# Patient Record
Sex: Male | Born: 1985 | Hispanic: Yes | Marital: Married | Smoking: Never smoker
Health system: Southern US, Community
[De-identification: ages and names within clinical notes are randomized; demographics above are authoritative.]

---

## 2014-12-28 ENCOUNTER — Emergency Department (HOSPITAL_COMMUNITY): Payer: Self-pay

## 2014-12-28 ENCOUNTER — Encounter (HOSPITAL_COMMUNITY): Payer: Self-pay | Admitting: Emergency Medicine

## 2014-12-28 ENCOUNTER — Emergency Department (HOSPITAL_COMMUNITY)
Admission: EM | Admit: 2014-12-28 | Discharge: 2014-12-28 | Disposition: A | Discharge status: Home / Self Care | Payer: Self-pay | Attending: Emergency Medicine | Admitting: Emergency Medicine

## 2014-12-28 DIAGNOSIS — J189 Pneumonia, unspecified organism: Secondary | ICD-10-CM

## 2014-12-28 DIAGNOSIS — J159 Unspecified bacterial pneumonia: Secondary | ICD-10-CM | POA: Insufficient documentation

## 2014-12-28 DIAGNOSIS — R000 Tachycardia, unspecified: Secondary | ICD-10-CM | POA: Insufficient documentation

## 2014-12-28 LAB — CBC WITH DIFFERENTIAL/PLATELET
BASOS ABS: 0 10*3/uL (ref 0.0–0.1)
Basophils Relative: 0 % (ref 0–1)
EOS ABS: 0.1 10*3/uL (ref 0.0–0.7)
Eosinophils Relative: 2 % (ref 0–5)
HEMATOCRIT: 39.5 % (ref 39.0–52.0)
HEMOGLOBIN: 13.4 g/dL (ref 13.0–17.0)
Lymphocytes Relative: 19 % (ref 12–46)
Lymphs Abs: 1.1 10*3/uL (ref 0.7–4.0)
MCH: 29.4 pg (ref 26.0–34.0)
MCHC: 33.9 g/dL (ref 30.0–36.0)
MCV: 86.6 fL (ref 78.0–100.0)
Monocytes Absolute: 0.6 10*3/uL (ref 0.1–1.0)
Monocytes Relative: 11 % (ref 3–12)
Neutro Abs: 4 10*3/uL (ref 1.7–7.7)
Neutrophils Relative %: 68 % (ref 43–77)
PLATELETS: 223 10*3/uL (ref 150–400)
RBC: 4.56 MIL/uL (ref 4.22–5.81)
RDW: 12.9 % (ref 11.5–15.5)
WBC: 5.8 10*3/uL (ref 4.0–10.5)

## 2014-12-28 LAB — BASIC METABOLIC PANEL
ANION GAP: 9 (ref 5–15)
BUN: 10 mg/dL (ref 6–20)
CALCIUM: 8.7 mg/dL — AB (ref 8.9–10.3)
CO2: 23 mmol/L (ref 22–32)
CREATININE: 0.75 mg/dL (ref 0.61–1.24)
Chloride: 102 mmol/L (ref 101–111)
GLUCOSE: 101 mg/dL — AB (ref 65–99)
Potassium: 3.7 mmol/L (ref 3.5–5.1)
Sodium: 134 mmol/L — ABNORMAL LOW (ref 135–145)

## 2014-12-28 MED ORDER — ACETAMINOPHEN 325 MG PO TABS
650.0000 mg | ORAL_TABLET | Freq: Once | ORAL | Status: AC
Start: 2014-12-28 — End: 2014-12-28
  Administered 2014-12-28: 650 mg via ORAL

## 2014-12-28 MED ORDER — AZITHROMYCIN 250 MG PO TABS: ORAL_TABLET | ORAL | Status: DC

## 2014-12-28 MED ORDER — GUAIFENESIN ER 600 MG PO TB12: 1200.0000 mg | ORAL_TABLET | Freq: Two times a day (BID) | ORAL | Status: DC

## 2014-12-28 MED ORDER — GUAIFENESIN-CODEINE 100-10 MG/5ML PO SOLN: 5.0000 mL | Freq: Four times a day (QID) | ORAL | Status: DC | PRN

## 2014-12-28 MED ORDER — ACETAMINOPHEN 325 MG PO TABS
ORAL_TABLET | ORAL | Status: AC
  Filled 2014-12-28: qty 2

## 2014-12-28 NOTE — ED Provider Notes (Signed)
CSN: [id number]     Arrival date & time 12/28/14  1910 History  This chart was scribed for Arthor Captain, working with Eber Hong, MD by Placido Sou, ED Scribe. This patient was seen in room TR03C/TR03C and the patient's care was started at 10:28 PM.     Chief Complaint  Patient presents with  . Cough    The patient said he has been coughing, fever and general malaise since friday.  He says his back and epigastric area hurts from coughing.  he has taken OTC remedies and nothing is working.  . Fever    The history is provided by the patient. A language interpreter was used.    HPI Comments: Cire Clute is a 29 y.o. male with a prior history of pneumonia, who presents to the Emergency Department complaining of a severe cough with onset 5 days ago. Pt notes pain to chest and back, fever, resolved headache, and diaphoresis. Pt has taken Motrin, Advil and cough syrup with no signs of improvement. Pt had a previous diagnosis of pneumonia in 2011 and notes these symptoms are similar.  Patient denies history of smoking or a PMHx of asthma and any other associated prior medical issues.    No intake or output data in the 24 hours ending 12/28/14 2203  History reviewed. No pertinent past medical history. History reviewed. No pertinent past surgical history. History reviewed. No pertinent family history. History  Substance Use Topics  . Smoking status: Never Smoker   . Smokeless tobacco: Never Used  . Alcohol Use: No    Review of Systems  A complete 10 system review of systems was obtained and all systems are negative except as noted in the HPI and PMH.      Allergies  Review of patient's allergies indicates not on file.  Home Medications   Prior to Admission medications   Not on File   BP 129/71 mmHg  Pulse 104  Temp(Src) 99.8 F (37.7 C) (Oral)  Resp 24  Ht  (1.6 m)  Wt 150 lb (68.04 kg)  BMI 26.58 kg/m2  SpO2 96% Physical Exam  Constitutional: He is  oriented to person, place, and time. He appears well-developed and well-nourished. No distress.  Sweaty and febrile;   HENT:  Head: Normocephalic and atraumatic.  Eyes: Conjunctivae and EOM are normal.  Neck: Neck supple.  Cardiovascular: Normal rate.   Heart mildly tachy  Pulmonary/Chest: Breath sounds normal. No respiratory distress.  Lung sounds normal;     Neurological: He is alert and oriented to person, place, and time.  Skin: Skin is warm.  Psychiatric: His behavior is normal.  Nursing note and vitals reviewed.   ED Course  Procedures  DIAGNOSTIC STUDIES: Oxygen Saturation is 96% on RA, adequate by my interpretation.    COORDINATION OF CARE: 10:36 PM Discussed treatment plan with pt at bedside including monitoring of O2 levels when ambulating as well as  additional medications. Pt also requested a note for work. Pt agreed to plan.  Labs Review Labs Reviewed  BASIC METABOLIC PANEL - Abnormal; Notable for the following:    Sodium 134 (*)    Glucose, Bld 101 (*)    Calcium 8.7 (*)    All other components within normal limits  CBC WITH DIFFERENTIAL/PLATELET    Imaging Review No results found.   EKG Interpretation None      MDM   Final diagnoses:  Community acquired pneumonia    Patient with community acquired Pneumonia. His  fever is resolved with antipyretic. Patient is able to ambulate in the ED with 02 sats above 90%. The patient will be discharged with abx and cough suppressant.labsa are reassuring. I personally reviewed the images using the PACS System. Discussed return precautions I personally performed the services described in this documentation, which was scribed in my presence. The recorded information has been reviewed and is accurate.       Arthor Captainbigail Mohannad Olivero, PA-C 01/02/15 2018  Eber HongBrian Miller, MD 01/03/15 [phone]

## 2014-12-28 NOTE — Discharge Instructions (Signed)
Neumona (Pneumonia) La neumona es una infeccin en los pulmones.  CAUSAS La neumona puede estar causada por una bacteria o un virus. Generalmente, estas infecciones estn causadas por la aspiracin de partculas infecciosas que ingresan a los pulmones (vas respiratorias). SIGNOS Y SNTOMAS   Tos.  Fiebre.  Dolor en el pecho.  Frecuencia respiratoria aumentada.  Sibilancias.  Produccin de mucosidad. DIAGNSTICO  Si presenta los sntomas comunes de la neumona, el mdico normalmente confirmar el diagnstico con una radiografa de trax. Si tiene neumona, la radiografa mostrar una anomala en el pulmn (infiltrados pulmonares). Podrn realizarse otras pruebas de sangre, orina o esputo para encontrar la causa especfica de su neumona. El mdico tambin puede hacer pruebas (como gases en sangre o una oximetra de pulso) para verificar el correcto funcionamiento de los pulmones. TRATAMIENTO  Algunos tipos de neumona pueden contagiarse a otras personas al toser o estornudar. Es posible que le pidan que utilice una mscara antes y durante el examen. Si la neumona est causada por una bacteria, puede tratarse con medicamentos antibiticos. Si la neumona es causada por el virus de la gripe, puede tratarse con medicamentos antivirales. La mayora de las dems infecciones virales deben seguir su curso. Estas infecciones no respondern a los antibiticos.  INSTRUCCIONES PARA EL CUIDADO EN EL HOGAR   Los inhibidores de la tos pueden utilizarse si no descansa bien. Sin embargo, la tos, al limpiar los pulmones, brinda una proteccin. Debe evitar, en lo posible, utilizar medicamentos para detener la tos.  Es posible que el mdico le haya recetado medicamentos si cree que la causa de su neumona es una bacteria o gripe. Finalice los medicamentos, aunque comience a sentirse mejor.  El mdico tambin puede haberle recetado un expectorante. Este afloja la mucosidad, para poder eliminarla con la  tos.  Tome los medicamentos solamente como se lo haya indicado el mdico.  No fume. El fumar es una de las causas ms frecuentes de bronquitis y puede contribuir a la neumona. Si es fumador y contina hacindolo, la tos puede durar varias semanas despus de que la neumona haya desaparecido.  Un vaporizador o humidificador con vapor fro en la habitacin o en la casa puede ayudar a aflojar la mucosidad.  La tos generalmente empeora por la noche. Duerma semisentado en una reposera o use un par de almohadas debajo de la cabeza.  Haga reposo todo el tiempo que lo necesite. El organismo por lo general le har saber si tiene ganas de descansar. PREVENCIN La vacuna antineumocccica est disponible para prevenir la neumona bacteriana comn. Habitualmente, se recomienda para:  Personas mayores de 65 aos.  Pacientes que estn en tratamiento de quimioterapia.  Personas con trastornos pulmonares crnicos, como bronquitis o enfisema.  Personas con problemas del sistema inmunolgico. Si usted es mayor de 65 aos o tiene un trastorno que lo pone en situacin de alto riesgo, es posible que reciba una vacuna antineumoccica, si todava no la tiene. En algunos pases, tambin se recomienda la aplicacin de rutina de la vacuna contra la gripe. Esta vacuna puede ayudar a prevenir algunos casos de neumona. Es posible que le ofrezcan aplicarse la vacuna contra la gripe como parte del tratamiento. Si fuma, es el momento de abandonar el hbito. Puede recibir instrucciones acerca de cmo dejar de fumar. El mdico puede darle medicamentos y asesoramiento para ayudarlo. SOLICITE ATENCIN MDICA SI: Tiene fiebre. SOLICITE ATENCIN MDICA DE INMEDIATO SI:   La enfermedad empeora. Esto vale especialmente en el caso de que usted sea una persona   mayor o se encuentre dbil por otra enfermedad.  No puede controlar la tos con antitusivos y no puede dormir debido a ello.  Comienza a escupir sangre al toser.  El  dolor empeora o no puede controlarlo con los medicamentos.  Alguno de los sntomas que inicialmente lo llevaron a la consulta empeora en vez de mejorar.  Siente falta de aire o dolor en el pecho. ASEGRESE DE QUE:   Comprende estas instrucciones.  Controlar su afeccin.  Recibir ayuda de inmediato si no mejora o si empeora. Document Released: 05/02/2005 Document Revised: 12/07/2013 ExitCare Patient Information 2015 [employer]. This information is not intended to replace advice given to you by your health care provider. Make sure you discuss any questions you have with your health care provider.  

## 2014-12-28 NOTE — ED Notes (Signed)
The patient said he has been coughing, fever and general malaise since friday.  He says his back and epigastric area hurts from coughing.  he has taken OTC remedies and nothing is working.  The patient rates his pain 10/10.

## 2014-12-28 NOTE — ED Notes (Signed)
97% on RA while ambulating 

## 2014-12-28 NOTE — ED Notes (Signed)
Pt. Left with all belongings and refused wheelchair 

## 2016-01-03 ENCOUNTER — Emergency Department (HOSPITAL_COMMUNITY)
Admission: EM | Admit: 2016-01-03 | Discharge: 2016-01-03 | Disposition: A | Discharge status: Home / Self Care | Payer: Self-pay | Attending: Emergency Medicine | Admitting: Emergency Medicine

## 2016-01-03 ENCOUNTER — Encounter (HOSPITAL_COMMUNITY): Payer: Self-pay | Admitting: Emergency Medicine

## 2016-01-03 DIAGNOSIS — L259 Unspecified contact dermatitis, unspecified cause: Secondary | ICD-10-CM | POA: Insufficient documentation

## 2016-01-03 DIAGNOSIS — Z79899 Other long term (current) drug therapy: Secondary | ICD-10-CM | POA: Insufficient documentation

## 2016-01-03 MED ORDER — DEXAMETHASONE SODIUM PHOSPHATE 10 MG/ML IJ SOLN
10.0000 mg | Freq: Once | INTRAMUSCULAR | Status: AC
  Administered 2016-01-03: 10 mg via INTRAMUSCULAR
  Filled 2016-01-03: qty 1

## 2016-01-03 MED ORDER — HYDROXYZINE HCL 25 MG PO TABS: 25.0000 mg | ORAL_TABLET | Freq: Four times a day (QID) | ORAL | Status: DC

## 2016-01-03 MED ORDER — PREDNISONE 20 MG PO TABS: ORAL_TABLET | ORAL | Status: DC

## 2016-01-03 NOTE — ED Notes (Signed)
Pt. reports persistent generalized  itchy skin rashes for 1 week , denies fever /respirations unlabored .

## 2016-01-03 NOTE — Discharge Instructions (Signed)
Dermatitis de contacto (Contact Dermatitis) La dermatitis es el enrojecimiento, el dolor y la hinchazn (inflamacin) de la piel. La dermatitis de contacto es una reaccin a ciertas sustancias que entran en contacto con la piel. Hay dos tipos de dermatitis de contacto:   Dermatitis de contacto irritativa. La causa de este tipo de dermatitis es algo que irrita la piel, como las manos secas por lavarlas en exceso. Este tipo no requiere la exposicin previa a la sustancia que caus la reaccin. Este tipo es ms frecuente.  Dermatitis alrgica por contacto. La causa de este tipo de dermatitis es una sustancia a la cual se es Air cabin crew, como una alergia al nquel o a la hiedra venenosa. Este tipo solo ocurre si ha estado expuesto anteriormente a la sustancia (alrgeno). Al repetir la exposicin, el organismo reacciona a la sustancia. Este tipo es menos frecuente. CAUSAS  Muchas sustancias diferentes pueden causar dermatitis de contacto. La causa ms frecuente de la dermatitis de contacto irritativa es la exposicin a lo siguiente:   Maquillaje.   Jabones perfumados.   Detergentes.   Lavandina.   cidos.   Sales metlicas, como el nquel.  Las causas de la dermatitis alrgica son las siguientes:   Plantas venenosas.   Productos qumicos.   Alhajas.   Ltex.   Medicamentos.   Conservantes que se utilizan en determinados productos, como la ropa.  FACTORES DE RIESGO Es ms probable que Personnel officer se manifieste en:   Las personas que tienen trabajos que las exponen a irritantes o a Futures trader.  Las [employer] tienen determinadas enfermedades, por ejemplo, asma o eccema.  SNTOMAS  Los sntomas de esta afeccin pueden presentarse en cualquier parte del cuerpo con la que usted toque el irritante o donde la sustancia irritante lo haya tocado. Algunos sntomas son los siguientes:  Sequedad o Teacher, music.   Enrojecimiento.   Grietas.   Picazn.   Dolor o  sensacin de ardor.   Ampollas.  Secrecin de pequeas cantidades de sangre o de lquido transparente que emanan de las grietas de la piel. En el caso de la dermatitis de Risk manager, puede haber hinchazn solo en algunas partes del cuerpo, como la boca o los genitales.  DIAGNSTICO  Esta afeccin se diagnostica mediante la historia clnica y un examen fsico. Se puede realizar una prueba del parche para ayudar a Office manager causa. Si la afeccin guarda relacin con Leander Rams, tal vez deba consultar a un especialista en medicina ocupacional. TRATAMIENTO El tratamiento de esta afeccin incluye determinar la causa de la reaccin y proteger la piel de nuevos contactos. El tratamiento tambin puede incluir lo siguiente:   Cremas o ungentos con corticoides. En los casos ms graves ser necesario aplicar corticoides por va oral.  Ungentos con antibiticos o antibacterianos, si hay una infeccin en la piel.  Antihistamnicos en forma de locin o por va oral para calmar la picazn.  Un vendaje. INSTRUCCIONES PARA EL CUIDADO EN EL HOGAR Cuidado de la piel  Humctese la piel segn sea necesario.   Aplique compresas fras en las zonas afectadas.  Trate de tomar un bao con lo siguiente:  Sales de [place]. Siga las instrucciones del envase. Puede conseguirlas en la tienda de comestibles o la farmacia local.  Bicarbonato de sodio. Vierta un poco en la baera como se lo haya indicado el [place] instrucciones del envase. Puede conseguirla en la tienda de comestibles o la farmacia local.  Intente colocarse una pasta de bicarbonato de  sodio sobre la piel. Agregue agua al bicarbonato hasta que tenga la consistencia de una pasta.  No se rasque la piel.  Bese con menos frecuencia, por ejemplo, [employer].  Bese con agua templada. No use agua caliente. [place] o aplquese los medicamentos de venta libre y recetados solamente como se lo  haya indicado el mdico.   Si le recetaron un antibitico, tmelo o aplqueselo como se lo haya indicado el mdico. No deje de usar el antibitico aunque la afeccin empiece a Teacher, English as a foreign language. Instrucciones generales  Concurra a todas las visitas de control como se lo haya indicado el mdico. Esto es importante.  Evite la sustancia que ha causado la erupcin. Si no sabe qu la caus, lleve un diario para tratar de identificar la causa. Escriba los siguientes datos:  Lo que come.  Los cosmticos que [place].  Lo que bebe.  Lo que llev puesto en la zona afectada. [place] alhajas.  Si le indicaron que use un vendaje, cudelo como se lo haya indicado el mdico. Esto incluye saber cundo cambiarlo y cundo quitrselo. SOLICITE ATENCIN MDICA SI:   La afeccin no mejora con tratamiento.  La afeccin empeora.  Observa signos de infeccin, como hinchazn, sensibilidad, enrojecimiento, dolor o calor en la zona afectada.  Tiene fiebre.  Aparecen nuevos sntomas. SOLICITE ATENCIN MDICA DE INMEDIATO SI:   Tiene dolor de cabeza intenso, dolor o rigidez en el cuello.  Vomita.  Se siente muy somnoliento.  Nota una lnea roja en la piel que sale de la zona afectada.  El hueso o la articulacin que se encuentran por debajo de la zona afectada le duelen despus de que la piel se haya curado.  La zona afectada se oscurece.  Tiene dificultad para respirar.   Esta informacin no tiene Marine scientist el consejo del mdico. Asegrese de hacerle al mdico cualquier pregunta que tenga.   Document Released: 05/02/2005 Document Revised: 04/13/2015 Elsevier Interactive Patient Education [employer].

## 2016-01-03 NOTE — ED Provider Notes (Signed)
CSN: [id number]     Arrival date & time 01/03/16  0039 History  By signing my name below, I, Curtis Petersen, attest that this documentation has been prepared under the direction and in the presence of Curtis Creasehristopher J Zarian Colpitts, MD. Electronically Signed: Bethel BornBritney Petersen, ED Scribe. 01/03/2016. 1:16 AM   Chief Complaint  Patient presents with  . Rash   The history is provided by the patient. A language interpreter was used.   Curtis Petersen is a 30 y.o. male who presents to the Emergency Department complaining of a new, generalized, pruritic rash with onset 1 week ago. He spent some time outside barefoot prior to the onset of symptoms but  denies new, soap, detergent, lotion, medication, or other exposure. Pt also denies any SOB, difficulty breathing, or swelling.   The pt is primarily Spanish-speaking, his wife is at bedside translating.   History reviewed. No pertinent past medical history. No past surgical history on file. No family history on file. Social History  Substance Use Topics  . Smoking status: Never Smoker   . Smokeless tobacco: Never Used  . Alcohol Use: No    Review of Systems  HENT: Negative for facial swelling and trouble swallowing.   Respiratory: Negative for shortness of breath.   Skin: Positive for rash.  All other systems reviewed and are negative.  Allergies  Review of patient's allergies indicates no known allergies.  Home Medications   Prior to Admission medications   Medication Sig Start Date End Date Taking? Authorizing Provider  azithromycin (ZITHROMAX Z-PAK) 250 MG tablet 2 po day one, then 1 daily x 4 days 12/28/14   Arthor CaptainAbigail Harris, PA-C  guaiFENesin (MUCINEX) 600 MG 12 hr tablet Take 2 tablets (1,200 mg total) by mouth 2 (two) times daily. 12/28/14   Arthor CaptainAbigail Harris, PA-C  guaiFENesin-codeine 100-10 MG/5ML syrup Take 5-10 mLs by mouth every 6 (six) hours as needed for cough. 12/28/14   Arthor CaptainAbigail Harris, PA-C  hydrOXYzine (ATARAX/VISTARIL) 25 MG tablet  Take 1 tablet (25 mg total) by mouth every 6 (six) hours. 01/03/16   Curtis Creasehristopher J Curtis Staiger, MD  predniSONE (DELTASONE) 20 MG tablet 3 tabs po daily x 3 days, then 2 tabs x 3 days, then 1.5 tabs x 3 days, then 1 tab x 3 days, then 0.5 tabs x 3 days 01/03/16   Curtis Creasehristopher J Jayzen Paver, MD   BP 127/85 mmHg  Pulse 64  Temp(Src) 97.3 F (36.3 C) (Oral)  Resp 20  Ht 5\' 3"  (1.6 m)  Wt 159 lb 6 oz (72.292 kg)  BMI 28.24 kg/m2  SpO2 99% Physical Exam  Constitutional: He is oriented to person, place, and time. He appears well-developed and well-nourished. No distress.  HENT:  Head: Normocephalic and atraumatic.  Right Ear: Hearing normal.  Left Ear: Hearing normal.  Nose: Nose normal.  Mouth/Throat: Oropharynx is clear and moist and mucous membranes are normal.  Eyes: Conjunctivae and EOM are normal. Pupils are equal, round, and reactive to light.  Neck: Normal range of motion. Neck supple.  Cardiovascular: Regular rhythm, S1 normal and S2 normal.  Exam reveals no gallop and no friction rub.   No murmur heard. Pulmonary/Chest: Effort normal and breath sounds normal. No respiratory distress. He exhibits no tenderness.  Abdominal: Soft. Normal appearance and bowel sounds are normal. There is no hepatosplenomegaly. There is no tenderness. There is no rebound, no guarding, no tenderness at McBurney's point and negative Murphy's sign. No hernia.  Musculoskeletal: Normal range of motion.  Neurological: He is  alert and oriented to person, place, and time. He has normal strength. No cranial nerve deficit or sensory deficit. Coordination normal. GCS eye subscore is 4. GCS verbal subscore is 5. GCS motor subscore is 6.  Skin: Skin is warm, dry and intact. Rash noted. No cyanosis.  Diffuse, erythematous, confluent, patchy rash with vesicles   Psychiatric: He has a normal mood and affect. His speech is normal and behavior is normal. Thought content normal.  Nursing note and vitals reviewed.   ED Course   Procedures (including critical care time) DIAGNOSTIC STUDIES: Oxygen Saturation is 99% on RA,  normal by my interpretation.    COORDINATION OF CARE: 1:14 AM Discussed treatment plan which includes Decadron with pt at bedside and pt agreed to plan.  Labs Review Labs Reviewed - No data to display  Imaging Review No results found.   EKG Interpretation None      MDM   Final diagnoses:  Contact dermatitis   Patient presents to the emergency department for evaluation of an itchy rash that has been present for 1 week. Morphology of rash is consistent with a contact dermatitis. Patient to be treated with Decadron, prednisone, hydroxyzine.  I personally performed the services described in this documentation, which was scribed in my presence. The recorded information has been reviewed and is accurate.    Curtis Crease, MD 01/03/16 [phone]

## 2017-12-22 ENCOUNTER — Encounter (HOSPITAL_COMMUNITY): Payer: Self-pay | Admitting: Emergency Medicine

## 2017-12-22 ENCOUNTER — Ambulatory Visit (HOSPITAL_COMMUNITY)
Admission: EM | Admit: 2017-12-22 | Discharge: 2017-12-24 | Disposition: A | Discharge status: Home / Self Care | Payer: Medicaid Other | Attending: General Surgery | Admitting: General Surgery

## 2017-12-22 ENCOUNTER — Emergency Department (HOSPITAL_COMMUNITY): Payer: Medicaid Other

## 2017-12-22 ENCOUNTER — Encounter (HOSPITAL_COMMUNITY): Payer: Self-pay

## 2017-12-22 ENCOUNTER — Other Ambulatory Visit: Payer: Self-pay

## 2017-12-22 ENCOUNTER — Emergency Department (HOSPITAL_COMMUNITY)
Admission: EM | Admit: 2017-12-22 | Discharge: 2017-12-22 | Disposition: A | Discharge status: Home / Self Care | Payer: Medicaid Other | Source: Home / Self Care | Attending: Emergency Medicine | Admitting: Emergency Medicine

## 2017-12-22 DIAGNOSIS — K3580 Unspecified acute appendicitis: Secondary | ICD-10-CM | POA: Diagnosis present

## 2017-12-22 DIAGNOSIS — Z87891 Personal history of nicotine dependence: Secondary | ICD-10-CM | POA: Diagnosis not present

## 2017-12-22 DIAGNOSIS — R05 Cough: Secondary | ICD-10-CM

## 2017-12-22 DIAGNOSIS — Z79899 Other long term (current) drug therapy: Secondary | ICD-10-CM | POA: Insufficient documentation

## 2017-12-22 DIAGNOSIS — K352 Acute appendicitis with generalized peritonitis, without abscess: Secondary | ICD-10-CM

## 2017-12-22 DIAGNOSIS — R059 Cough, unspecified: Secondary | ICD-10-CM

## 2017-12-22 LAB — URINALYSIS, ROUTINE W REFLEX MICROSCOPIC
Bacteria, UA: NONE SEEN
Bilirubin Urine: NEGATIVE
GLUCOSE, UA: NEGATIVE mg/dL
Ketones, ur: NEGATIVE mg/dL
Leukocytes, UA: NEGATIVE
NITRITE: NEGATIVE
PH: 5 (ref 5.0–8.0)
PROTEIN: NEGATIVE mg/dL
SPECIFIC GRAVITY, URINE: 1.03 (ref 1.005–1.030)

## 2017-12-22 LAB — COMPREHENSIVE METABOLIC PANEL
ALBUMIN: 4.3 g/dL (ref 3.5–5.0)
ALBUMIN: 4.5 g/dL (ref 3.5–5.0)
ALK PHOS: 68 U/L (ref 38–126)
ALK PHOS: 66 U/L (ref 38–126)
ALT: 36 U/L (ref 17–63)
ALT: 34 U/L (ref 17–63)
ANION GAP: 7 (ref 5–15)
AST: 25 U/L (ref 15–41)
AST: 27 U/L (ref 15–41)
Anion gap: 10 (ref 5–15)
BILIRUBIN TOTAL: 0.6 mg/dL (ref 0.3–1.2)
BUN: 11 mg/dL (ref 6–20)
BUN: 15 mg/dL (ref 6–20)
CALCIUM: 9.3 mg/dL (ref 8.9–10.3)
CALCIUM: 9.3 mg/dL (ref 8.9–10.3)
CO2: 24 mmol/L (ref 22–32)
CO2: 22 mmol/L (ref 22–32)
Chloride: 106 mmol/L (ref 101–111)
Chloride: 105 mmol/L (ref 101–111)
Creatinine, Ser: 0.76 mg/dL (ref 0.61–1.24)
Creatinine, Ser: 0.69 mg/dL (ref 0.61–1.24)
GFR calc Af Amer: >60 mL/min (ref >60)
GFR calc Af Amer: >60 mL/min (ref >60)
GFR calc non Af Amer: >60 mL/min (ref >60)
GLUCOSE: 107 mg/dL — AB (ref 65–99)
GLUCOSE: 100 mg/dL — AB (ref 65–99)
POTASSIUM: 3.5 mmol/L (ref 3.5–5.1)
Potassium: 4 mmol/L (ref 3.5–5.1)
Sodium: 137 mmol/L (ref 135–145)
Sodium: 137 mmol/L (ref 135–145)
TOTAL PROTEIN: 7.7 g/dL (ref 6.5–8.1)
Total Bilirubin: 0.6 mg/dL (ref 0.3–1.2)
Total Protein: 7.9 g/dL (ref 6.5–8.1)

## 2017-12-22 LAB — CBC
HCT: 45 % (ref 39.0–52.0)
HCT: 45.1 % (ref 39.0–52.0)
Hemoglobin: 15.3 g/dL (ref 13.0–17.0)
Hemoglobin: 15.1 g/dL (ref 13.0–17.0)
MCH: 29.7 pg (ref 26.0–34.0)
MCH: 29.3 pg (ref 26.0–34.0)
MCHC: 34 g/dL (ref 30.0–36.0)
MCHC: 33.5 g/dL (ref 30.0–36.0)
MCV: 87.2 fL (ref 78.0–100.0)
MCV: 87.4 fL (ref 78.0–100.0)
PLATELETS: 254 10*3/uL (ref 150–400)
Platelets: 244 10*3/uL (ref 150–400)
RBC: 5.16 MIL/uL (ref 4.22–5.81)
RBC: 5.16 MIL/uL (ref 4.22–5.81)
RDW: 12.3 % (ref 11.5–15.5)
RDW: 12.3 % (ref 11.5–15.5)
WBC: 7.4 10*3/uL (ref 4.0–10.5)
WBC: 10.2 10*3/uL (ref 4.0–10.5)

## 2017-12-22 LAB — LIPASE, BLOOD
Lipase: 45 U/L (ref 11–51)
Lipase: 28 U/L (ref 11–51)

## 2017-12-22 MED ORDER — BENZONATATE 100 MG PO CAPS: 100.0000 mg | ORAL_CAPSULE | Freq: Three times a day (TID) | ORAL | 0 refills | Status: DC | PRN

## 2017-12-22 NOTE — ED Provider Notes (Signed)
[facility] EMERGENCY DEPARTMENT Provider Note   CSN: [id number] Arrival date & time: 12/22/17  1125     History   Chief Complaint Chief Complaint  Patient presents with  . Abdominal Pain    HPI Stclair Szymborski is a 32 y.o. male.  The history is provided by the patient and medical records. A language interpreter was used (Spanish video interpreter).   Keoni Risinger Daqwan is an otherwise healthy 32 y.o. male who presents to the Emergency Department complaining of dry cough for the last 4 days. He will have coughing fits and afterwards, the upper part of his abdomen hurt. He will also feel nauseous for several minutes following coughing fits. He denies any nausea currently. No vomiting. He reports associated nasal congestion as well. He has had pneumonia in the past when he had similar symptoms. He reports coming to the ER today to ensure that he does not have PNA again. No fever/chills. No chest pain / shortness of breath. No diarrhea, constipation or blood in the stool. Had a normal BM just prior to ER arrival. No medication taken prior to arrival for symptoms.   History reviewed. No pertinent past medical history.  There are no active problems to display for this patient.   History reviewed. No pertinent surgical history.      Home Medications    Prior to Admission medications   Medication Sig Start Date End Date Taking? Authorizing Provider  azithromycin (ZITHROMAX Z-PAK) 250 MG tablet 2 po day one, then 1 daily x 4 days 12/28/14   Arthor Captain, PA-C  benzonatate (TESSALON) 100 MG capsule Take 1 capsule (100 mg total) by mouth 3 (three) times daily as needed for cough. 12/22/17   Ward, Chase Picket, PA-C  guaiFENesin (MUCINEX) 600 MG 12 hr tablet Take 2 tablets (1,200 mg total) by mouth 2 (two) times daily. 12/28/14   Harris, Cammy Copa, PA-C  guaiFENesin-codeine 100-10 MG/5ML syrup Take 5-10 mLs by mouth every 6 (six) hours as needed for cough. 12/28/14    Arthor Captain, PA-C  hydrOXYzine (ATARAX/VISTARIL) 25 MG tablet Take 1 tablet (25 mg total) by mouth every 6 (six) hours. 01/03/16   Gilda Crease, MD  predniSONE (DELTASONE) 20 MG tablet 3 tabs po daily x 3 days, then 2 tabs x 3 days, then 1.5 tabs x 3 days, then 1 tab x 3 days, then 0.5 tabs x 3 days 01/03/16   Gilda Crease, MD    Family History No family history on file.  Social History Social History   Tobacco Use  . Smoking status: Never Smoker  . Smokeless tobacco: Never Used  Substance Use Topics  . Alcohol use: No  . Drug use: No     Allergies   Patient has no known allergies.   Review of Systems Review of Systems  HENT: Positive for congestion. Negative for sore throat.   Respiratory: Positive for cough. Negative for shortness of breath and wheezing.   Cardiovascular: Negative for chest pain.  Gastrointestinal: Positive for abdominal pain and nausea. Negative for constipation, diarrhea and vomiting.  Genitourinary: Negative for dysuria.  All other systems reviewed and are negative.    Physical Exam Updated Vital Signs BP (!) 142/85   Pulse 81   Temp 98.4 F (36.9 C)   Resp 16   SpO2 100%   Physical Exam  Constitutional: He is oriented to person, place, and time. He appears well-developed and well-nourished. No distress.  Well-appearing.  HENT:  Head:  Normocephalic and atraumatic.  Mouth/Throat: Oropharynx is clear and moist. No oropharyngeal exudate.  Cardiovascular: Normal rate, regular rhythm and normal heart sounds.  No murmur heard. Pulmonary/Chest: Effort normal and breath sounds normal. No respiratory distress.  Lungs clear to ausculation bilaterally.  Abdominal: Soft. Bowel sounds are normal. He exhibits no distension.  No abdominal tenderness.  Musculoskeletal: He exhibits no edema.  Neurological: He is alert and oriented to person, place, and time.  Skin: Skin is warm and dry.  Nursing note and vitals reviewed.    ED  Treatments / Results  Labs (all labs ordered are listed, but only abnormal results are displayed) Labs Reviewed  COMPREHENSIVE METABOLIC PANEL - Abnormal; Notable for the following components:      Result Value   Glucose, Bld 107 (*)    All other components within normal limits  LIPASE, BLOOD  CBC  URINALYSIS, ROUTINE W REFLEX MICROSCOPIC    EKG EKG Interpretation  Date/Time:  /19/19 1256       Ward, Chase Picket, PA-C 12/22/17 1307    Terrilee Files, MD 12/23/17 [phone]

## 2017-12-22 NOTE — ED Triage Notes (Signed)
Patient complains of abdominal pain and nonproductive cough that started several days ago. Patient states he came in today because to check if he had pneumonia.

## 2017-12-22 NOTE — Discharge Instructions (Signed)
It was my pleasure taking care of you today!   Your symptoms are likely due to a viral upper respiratory infection. Fortunately, we did not see evidence of serious infection and can treat your symptoms. Tessalon as needed for cough. Mucinex (over-the-counter) is great for nasal congestion. Tylenol or Ibuprofen if needed for pain.   Rest, drink plenty of fluids to be sure you are staying hydrated.   Please follow up with your primary doctor for discussion of your diagnoses and further evaluation after today's visit if symptoms persist longer than 7 days; Return to the ER for high fevers, difficulty breathing or other concerning symptoms

## 2017-12-22 NOTE — ED Triage Notes (Signed)
Pt having LLQ abd pain since yesterday along with n/v/d/fever, pain is making it difficult to walk. Pt also c/o of a dry cough

## 2017-12-23 ENCOUNTER — Encounter (HOSPITAL_COMMUNITY): Payer: Self-pay | Admitting: Orthopedic Surgery

## 2017-12-23 ENCOUNTER — Observation Stay (HOSPITAL_COMMUNITY): Payer: Medicaid Other | Admitting: Anesthesiology

## 2017-12-23 ENCOUNTER — Emergency Department (HOSPITAL_COMMUNITY): Payer: Medicaid Other

## 2017-12-23 ENCOUNTER — Encounter (HOSPITAL_COMMUNITY)
Admission: EM | Disposition: A | Discharge status: Home / Self Care | Payer: Self-pay | Source: Home / Self Care | Attending: Emergency Medicine

## 2017-12-23 DIAGNOSIS — K3580 Unspecified acute appendicitis: Secondary | ICD-10-CM | POA: Diagnosis not present

## 2017-12-23 DIAGNOSIS — Z87891 Personal history of nicotine dependence: Secondary | ICD-10-CM | POA: Diagnosis not present

## 2017-12-23 HISTORY — PX: LAPAROSCOPIC APPENDECTOMY: SHX408

## 2017-12-23 SURGERY — APPENDECTOMY, LAPAROSCOPIC
Anesthesia: General | Site: Abdomen

## 2017-12-23 MED ORDER — KETOROLAC TROMETHAMINE 30 MG/ML IJ SOLN
INTRAMUSCULAR | Status: AC
  Filled 2017-12-23: qty 1

## 2017-12-23 MED ORDER — HEPARIN SODIUM (PORCINE) 5000 UNIT/ML IJ SOLN
5000.0000 [IU] | Freq: Three times a day (TID) | INTRAMUSCULAR | Status: DC
  Administered 2017-12-23 – 2017-12-24 (×2): 5000 [IU] via SUBCUTANEOUS
  Filled 2017-12-23 (×2): qty 1

## 2017-12-23 MED ORDER — TRAMADOL HCL 50 MG PO TABS
50.0000 mg | ORAL_TABLET | Freq: Four times a day (QID) | ORAL | Status: DC | PRN
  Administered 2017-12-23 – 2017-12-24 (×2): 50 mg via ORAL
  Filled 2017-12-23 (×2): qty 1

## 2017-12-23 MED ORDER — SUCCINYLCHOLINE CHLORIDE 200 MG/10ML IV SOSY
PREFILLED_SYRINGE | INTRAVENOUS | Status: AC
  Filled 2017-12-23: qty 10

## 2017-12-23 MED ORDER — IOHEXOL 300 MG/ML  SOLN
100.0000 mL | Freq: Once | INTRAMUSCULAR | Status: AC | PRN
  Administered 2017-12-23: 100 mL via INTRAVENOUS

## 2017-12-23 MED ORDER — LACTATED RINGERS IV SOLN: INTRAVENOUS | Status: DC

## 2017-12-23 MED ORDER — FENTANYL CITRATE (PF) 100 MCG/2ML IJ SOLN
INTRAMUSCULAR | Status: DC | PRN
  Administered 2017-12-23 (×3): 50 ug via INTRAVENOUS
  Administered 2017-12-23: 100 ug via INTRAVENOUS

## 2017-12-23 MED ORDER — SODIUM CHLORIDE 0.9 % IR SOLN
Status: DC | PRN
  Administered 2017-12-23: 1000 mL

## 2017-12-23 MED ORDER — HYDROMORPHONE HCL 2 MG/ML IJ SOLN: 0.5000 mg | INTRAMUSCULAR | Status: DC | PRN

## 2017-12-23 MED ORDER — HYDRALAZINE HCL 20 MG/ML IJ SOLN: 10.0000 mg | INTRAMUSCULAR | Status: DC | PRN

## 2017-12-23 MED ORDER — MIDAZOLAM HCL 2 MG/2ML IJ SOLN
INTRAMUSCULAR | Status: AC
  Filled 2017-12-23: qty 2

## 2017-12-23 MED ORDER — SUGAMMADEX SODIUM 200 MG/2ML IV SOLN
INTRAVENOUS | Status: DC | PRN
  Administered 2017-12-23: 200 mg via INTRAVENOUS

## 2017-12-23 MED ORDER — BUPIVACAINE-EPINEPHRINE (PF) 0.25% -1:200000 IJ SOLN
INTRAMUSCULAR | Status: AC
  Filled 2017-12-23: qty 30

## 2017-12-23 MED ORDER — KETOROLAC TROMETHAMINE 30 MG/ML IJ SOLN
INTRAMUSCULAR | Status: DC | PRN
  Administered 2017-12-23: 30 mg via INTRAVENOUS

## 2017-12-23 MED ORDER — METRONIDAZOLE IN NACL 5-0.79 MG/ML-% IV SOLN: 500.0000 mg | Freq: Three times a day (TID) | INTRAVENOUS | Status: DC

## 2017-12-23 MED ORDER — PROPOFOL 10 MG/ML IV BOLUS
INTRAVENOUS | Status: DC | PRN
  Administered 2017-12-23: 200 mg via INTRAVENOUS

## 2017-12-23 MED ORDER — CEFTRIAXONE SODIUM 2 G IJ SOLR
2.0000 g | Freq: Once | INTRAMUSCULAR | Status: AC
  Administered 2017-12-23: 2 g via INTRAVENOUS
  Filled 2017-12-23: qty 20

## 2017-12-23 MED ORDER — DEXAMETHASONE SODIUM PHOSPHATE 10 MG/ML IJ SOLN
INTRAMUSCULAR | Status: AC
  Filled 2017-12-23: qty 1

## 2017-12-23 MED ORDER — SUGAMMADEX SODIUM 200 MG/2ML IV SOLN
INTRAVENOUS | Status: AC
  Filled 2017-12-23: qty 2

## 2017-12-23 MED ORDER — ACETAMINOPHEN 500 MG PO TABS
1000.0000 mg | ORAL_TABLET | Freq: Four times a day (QID) | ORAL | Status: DC
  Administered 2017-12-23 – 2017-12-24 (×4): 1000 mg via ORAL
  Filled 2017-12-23 (×4): qty 2

## 2017-12-23 MED ORDER — OXYCODONE HCL 5 MG PO TABS: 5.0000 mg | ORAL_TABLET | Freq: Once | ORAL | Status: DC | PRN

## 2017-12-23 MED ORDER — DOCUSATE SODIUM 100 MG PO CAPS
200.0000 mg | ORAL_CAPSULE | Freq: Two times a day (BID) | ORAL | Status: DC
  Administered 2017-12-23 – 2017-12-24 (×2): 200 mg via ORAL
  Filled 2017-12-23 (×2): qty 2

## 2017-12-23 MED ORDER — LIDOCAINE 2% (20 MG/ML) 5 ML SYRINGE
INTRAMUSCULAR | Status: AC
  Filled 2017-12-23: qty 5

## 2017-12-23 MED ORDER — BUPIVACAINE-EPINEPHRINE 0.25% -1:200000 IJ SOLN
INTRAMUSCULAR | Status: DC | PRN
  Administered 2017-12-23: 9 mL

## 2017-12-23 MED ORDER — DEXAMETHASONE SODIUM PHOSPHATE 10 MG/ML IJ SOLN
INTRAMUSCULAR | Status: DC | PRN
  Administered 2017-12-23: 10 mg via INTRAVENOUS

## 2017-12-23 MED ORDER — ONDANSETRON HCL 4 MG/2ML IJ SOLN
4.0000 mg | Freq: Once | INTRAMUSCULAR | Status: AC
  Administered 2017-12-23: 4 mg via INTRAVENOUS
  Filled 2017-12-23: qty 2

## 2017-12-23 MED ORDER — ONDANSETRON HCL 4 MG/2ML IJ SOLN: 4.0000 mg | Freq: Four times a day (QID) | INTRAMUSCULAR | Status: DC | PRN

## 2017-12-23 MED ORDER — SIMETHICONE 80 MG PO CHEW: 40.0000 mg | CHEWABLE_TABLET | Freq: Four times a day (QID) | ORAL | Status: DC | PRN

## 2017-12-23 MED ORDER — ROCURONIUM BROMIDE 10 MG/ML (PF) SYRINGE
PREFILLED_SYRINGE | INTRAVENOUS | Status: AC
  Filled 2017-12-23: qty 15

## 2017-12-23 MED ORDER — MORPHINE SULFATE (PF) 4 MG/ML IV SOLN
4.0000 mg | Freq: Once | INTRAVENOUS | Status: AC
  Administered 2017-12-23: 4 mg via INTRAVENOUS
  Filled 2017-12-23: qty 1

## 2017-12-23 MED ORDER — ONDANSETRON HCL 4 MG/2ML IJ SOLN
INTRAMUSCULAR | Status: DC | PRN
  Administered 2017-12-23: 4 mg via INTRAVENOUS

## 2017-12-23 MED ORDER — ONDANSETRON 4 MG PO TBDP: 4.0000 mg | ORAL_TABLET | Freq: Four times a day (QID) | ORAL | Status: DC | PRN

## 2017-12-23 MED ORDER — 0.9 % SODIUM CHLORIDE (POUR BTL) OPTIME
TOPICAL | Status: DC | PRN
  Administered 2017-12-23: 1000 mL

## 2017-12-23 MED ORDER — SUCCINYLCHOLINE CHLORIDE 20 MG/ML IJ SOLN
INTRAMUSCULAR | Status: DC | PRN
  Administered 2017-12-23: 100 mg via INTRAVENOUS

## 2017-12-23 MED ORDER — ROCURONIUM BROMIDE 100 MG/10ML IV SOLN
INTRAVENOUS | Status: DC | PRN
  Administered 2017-12-23: 50 mg via INTRAVENOUS

## 2017-12-23 MED ORDER — FENTANYL CITRATE (PF) 250 MCG/5ML IJ SOLN
INTRAMUSCULAR | Status: AC
  Filled 2017-12-23: qty 5

## 2017-12-23 MED ORDER — IBUPROFEN 400 MG PO TABS
600.0000 mg | ORAL_TABLET | Freq: Four times a day (QID) | ORAL | Status: DC
Start: 2017-12-23 — End: 2017-12-23

## 2017-12-23 MED ORDER — MIDAZOLAM HCL 5 MG/5ML IJ SOLN
INTRAMUSCULAR | Status: DC | PRN
  Administered 2017-12-23: 2 mg via INTRAVENOUS

## 2017-12-23 MED ORDER — SODIUM CHLORIDE 0.9 % IV SOLN
INTRAVENOUS | Status: DC
  Administered 2017-12-24: 06:00:00 via INTRAVENOUS

## 2017-12-23 MED ORDER — LACTATED RINGERS IV SOLN
INTRAVENOUS | Status: DC
  Administered 2017-12-23 (×2): via INTRAVENOUS

## 2017-12-23 MED ORDER — HYDROMORPHONE HCL 2 MG/ML IJ SOLN: 0.2500 mg | INTRAMUSCULAR | Status: DC | PRN

## 2017-12-23 MED ORDER — PROPOFOL 10 MG/ML IV BOLUS
INTRAVENOUS | Status: AC
  Filled 2017-12-23: qty 20

## 2017-12-23 MED ORDER — MORPHINE SULFATE (PF) 4 MG/ML IV SOLN: 1.0000 mg | INTRAVENOUS | Status: DC | PRN

## 2017-12-23 MED ORDER — MORPHINE SULFATE (PF) 4 MG/ML IV SOLN: 4.0000 mg | Freq: Once | INTRAVENOUS | Status: DC | PRN

## 2017-12-23 MED ORDER — DIPHENHYDRAMINE HCL 12.5 MG/5ML PO ELIX: 12.5000 mg | ORAL_SOLUTION | Freq: Four times a day (QID) | ORAL | Status: DC | PRN

## 2017-12-23 MED ORDER — LIDOCAINE HCL (CARDIAC) PF 100 MG/5ML IV SOSY
PREFILLED_SYRINGE | INTRAVENOUS | Status: DC | PRN
  Administered 2017-12-23: 60 mg via INTRAVENOUS

## 2017-12-23 MED ORDER — METRONIDAZOLE IN NACL 5-0.79 MG/ML-% IV SOLN
500.0000 mg | Freq: Once | INTRAVENOUS | Status: AC
  Administered 2017-12-23: 500 mg via INTRAVENOUS
  Filled 2017-12-23: qty 100

## 2017-12-23 MED ORDER — OXYCODONE HCL 5 MG/5ML PO SOLN: 5.0000 mg | Freq: Once | ORAL | Status: DC | PRN

## 2017-12-23 MED ORDER — DIPHENHYDRAMINE HCL 50 MG/ML IJ SOLN: 12.5000 mg | Freq: Four times a day (QID) | INTRAMUSCULAR | Status: DC | PRN

## 2017-12-23 MED ORDER — ONDANSETRON HCL 4 MG/2ML IJ SOLN
INTRAMUSCULAR | Status: AC
  Filled 2017-12-23: qty 2

## 2017-12-23 SURGICAL SUPPLY — 41 items
APPLIER CLIP ROT 10 11.4 M/L (STAPLE)
BLADE CLIPPER SURG (BLADE) IMPLANT
CANISTER SUCT 3000ML PPV (MISCELLANEOUS) ×3 IMPLANT
CHLORAPREP W/TINT 26ML (MISCELLANEOUS) ×3 IMPLANT
CLIP APPLIE ROT 10 11.4 M/L (STAPLE) IMPLANT
CLOSURE WOUND 1/2 X4 (GAUZE/BANDAGES/DRESSINGS) ×1
COVER SURGICAL LIGHT HANDLE (MISCELLANEOUS) ×3 IMPLANT
CUTTER FLEX LINEAR 45M (STAPLE) ×3 IMPLANT
DERMABOND ADVANCED (GAUZE/BANDAGES/DRESSINGS) ×2
DERMABOND ADVANCED .7 DNX12 (GAUZE/BANDAGES/DRESSINGS) ×1 IMPLANT
DRSG TEGADERM 2-3/8X2-3/4 SM (GAUZE/BANDAGES/DRESSINGS) ×9 IMPLANT
ELECT REM PT RETURN 9FT ADLT (ELECTROSURGICAL) ×3
ELECTRODE REM PT RTRN 9FT ADLT (ELECTROSURGICAL) ×1 IMPLANT
ENDOLOOP SUT PDS II  0 18 (SUTURE)
ENDOLOOP SUT PDS II 0 18 (SUTURE) IMPLANT
GLOVE BIOGEL PI IND STRL 8 (GLOVE) ×1 IMPLANT
GLOVE BIOGEL PI INDICATOR 8 (GLOVE) ×2
GLOVE ECLIPSE 7.5 STRL STRAW (GLOVE) ×3 IMPLANT
GOWN STRL REUS W/ TWL LRG LVL3 (GOWN DISPOSABLE) ×3 IMPLANT
GOWN STRL REUS W/TWL LRG LVL3 (GOWN DISPOSABLE) ×6
KIT BASIN OR (CUSTOM PROCEDURE TRAY) ×3 IMPLANT
KIT TURNOVER KIT B (KITS) ×3 IMPLANT
NS IRRIG 1000ML POUR BTL (IV SOLUTION) ×3 IMPLANT
PAD ARMBOARD 7.5X6 YLW CONV (MISCELLANEOUS) ×6 IMPLANT
POUCH SPECIMEN RETRIEVAL 10MM (ENDOMECHANICALS) ×3 IMPLANT
RELOAD 45 VASCULAR/THIN (ENDOMECHANICALS) IMPLANT
RELOAD STAPLE TA45 3.5 REG BLU (ENDOMECHANICALS) ×3 IMPLANT
SET IRRIG TUBING LAPAROSCOPIC (IRRIGATION / IRRIGATOR) ×3 IMPLANT
SHEARS HARMONIC ACE PLUS 36CM (ENDOMECHANICALS) ×3 IMPLANT
SLEEVE ENDOPATH XCEL 5M (ENDOMECHANICALS) ×3 IMPLANT
SPECIMEN JAR SMALL (MISCELLANEOUS) ×3 IMPLANT
STRIP CLOSURE SKIN 1/2X4 (GAUZE/BANDAGES/DRESSINGS) ×2 IMPLANT
SUT MNCRL AB 4-0 PS2 18 (SUTURE) ×3 IMPLANT
TOWEL OR 17X24 6PK STRL BLUE (TOWEL DISPOSABLE) ×3 IMPLANT
TOWEL OR 17X26 10 PK STRL BLUE (TOWEL DISPOSABLE) ×3 IMPLANT
TRAY FOLEY CATH SILVER 16FR (SET/KITS/TRAYS/PACK) ×3 IMPLANT
TRAY LAPAROSCOPIC MC (CUSTOM PROCEDURE TRAY) ×3 IMPLANT
TROCAR XCEL BLUNT TIP 100MML (ENDOMECHANICALS) ×3 IMPLANT
TROCAR XCEL NON-BLD 5MMX100MML (ENDOMECHANICALS) ×3 IMPLANT
TUBING INSUFFLATION (TUBING) ×3 IMPLANT
WATER STERILE IRR 1000ML POUR (IV SOLUTION) ×3 IMPLANT

## 2017-12-23 NOTE — Plan of Care (Signed)
  Problem: Pain Managment: Goal: General experience of comfort will improve Outcome: Progressing   Problem: Skin Integrity: Goal: Risk for impaired skin integrity will decrease Outcome: Progressing   

## 2017-12-23 NOTE — Transfer of Care (Signed)
Immediate Anesthesia Transfer of Care Note  Patient: Curtis Petersen  Procedure(s) Performed: LAPAROSCOPIC APPENDECTOMY (N/A Abdomen)  Patient Location: PACU  Anesthesia Type:General  Level of Consciousness: responds to stimulation  Airway & Oxygen Therapy: Patient Spontanous Breathing and Patient connected to nasal cannula oxygen  Post-op Assessment: Report given to RN and Post -op Vital signs reviewed and stable  Post vital signs: Reviewed and stable  Last Vitals:  Vitals Value Taken Time  BP 98/55 12/23/2017 10:39 AM  Temp    Pulse 87 12/23/2017 10:42 AM  Resp 16 12/23/2017 10:42 AM  SpO2 97 % 12/23/2017 10:42 AM  Vitals shown include unvalidated device data.  Last Pain:  Vitals:   12/23/17 0756  TempSrc: Oral  PainSc: 4       Patients Stated Pain Goal: 4 (12/23/17 0113)  Complications: No apparent anesthesia complications

## 2017-12-23 NOTE — ED Notes (Signed)
Patient transported to CT 

## 2017-12-23 NOTE — H&P (Signed)
CC: Abdominal pain; acute appendicitis, consult by Montine Circle, PA-C  HPI: Curtis Petersen is an 32 y.o. male with no known prior medical hx presents to ED overnight for evaluation of abdominal pain. Began 2-3d ago, more on the left but over last 12hrs, has localized more to the right. Sharp/crampy. Never had this before. Does not radiate. Nothing makes it better or worse. Associated nausea/vomiting. Denies constipation or diarrhea.  He was in the ED yesterday for coughing fits which made abdominal cramping worse but was discharged.  Past surgical history: Denies any prior operations  History reviewed. No pertinent past medical history.  History reviewed. No pertinent surgical history.  No family history on file.  Social: 2-3 cigarettes per day - quit 3 days ago. Denies EtOH/drug use. 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  Allergies: No Known Allergies  Medications: I have reviewed the patient's current medications.  Results for orders placed or performed during the hospital encounter of 12/22/17 (from the past 48 hour(s))  Lipase, blood     Status: None   Collection Time: 12/22/17  8:41 PM  Result Value Ref Range   Lipase 28 11 - 51 U/L    Comment: Performed at [facility], [place] [address] [place], [place] [address]  Comprehensive metabolic panel     Status: Abnormal   Collection Time: 12/22/17  8:41 PM  Result Value Ref Range   Sodium 137 135 - 145 mmol/L   Potassium 3.5 3.5 - 5.1 mmol/L   Chloride 105 101 - 111 mmol/L   CO2 22 22 - 32 mmol/L   Glucose, Bld 100 (H) 65 - 99 mg/dL   BUN 15 6 - 20 mg/dL   Creatinine, Ser 0.69 0.61 - 1.24 mg/dL   Calcium 9.3 8.9 - 10.3 mg/dL   Total Protein 7.7 6.5 - 8.1 g/dL   Albumin 4.5 3.5 - 5.0 g/dL   AST 27 15 - 41 U/L   ALT 34 17 - 63 U/L   Alkaline Phosphatase 66 38 - 126 U/L   Total Bilirubin 0.6 0.3 - 1.2 mg/dL   GFR calc non Af Amer >60 >60 mL/min   GFR calc Af Amer >60 >60 mL/min    Comment: (NOTE) The  eGFR has been calculated using the CKD EPI equation. This calculation has not been validated in all clinical situations. eGFR's persistently <60 mL/min signify possible Chronic Kidney Disease.    Anion gap 10 5 - 15    Comment: Performed at [facility] [address], [place], [place] [address]  CBC     Status: None   Collection Time: 12/22/17  8:41 PM  Result Value Ref Range   WBC 10.2 4.0 - 10.5 K/uL   RBC 5.16 4.22 - 5.81 MIL/uL   Hemoglobin 15.1 13.0 - 17.0 g/dL   HCT 45.1 39.0 - 52.0 %   MCV 87.4 78.0 - 100.0 fL   MCH 29.3 26.0 - 34.0 pg   MCHC 33.5 30.0 - 36.0 g/dL   RDW 12.3 11.5 - 15.5 %   Platelets 254 150 - 400 K/uL    Comment: Performed at [facility], [place] [address] [place], [place] [address]  Urinalysis, Routine w reflex microscopic     Status: Abnormal   Collection Time: 12/22/17  8:52 PM  Result Value Ref Range   Color, Urine YELLOW YELLOW   APPearance HAZY (A) CLEAR   Specific Gravity, Urine 1.030 1.005 - 1.030  pH 5.0 5.0 - 8.0   Glucose, UA NEGATIVE NEGATIVE mg/dL   Hgb urine dipstick MODERATE (A) NEGATIVE   Bilirubin Urine NEGATIVE NEGATIVE   Ketones, ur NEGATIVE NEGATIVE mg/dL   Protein, ur NEGATIVE NEGATIVE mg/dL   Nitrite NEGATIVE NEGATIVE   Leukocytes, UA NEGATIVE NEGATIVE   RBC / HPF 0-5 0 - 5 RBC/hpf   WBC, UA 0-5 0 - 5 WBC/hpf   Bacteria, UA NONE SEEN NONE SEEN   Squamous Epithelial / LPF 0-5 0 - 5   Mucus PRESENT    Ca Oxalate Crys, UA PRESENT     Comment: Performed at [facility] [address] [place], [place] [address]    Dg Chest 2 View  Result Date: 12/22/2017 CLINICAL DATA:  Abdominal pain with nonproductive cough several days. EXAM: CHEST - 2 VIEW COMPARISON:  12/28/2014 FINDINGS: Lungs are adequately inflated without focal airspace consolidation or effusion. Cardiomediastinal silhouette, bones and soft tissues are unchanged. IMPRESSION: No active cardiopulmonary disease. Electronically Signed   By: Marin Olp  M.D.   On: 12/22/2017 12:30   Ct Abdomen Pelvis W Contrast  Result Date: 12/23/2017 CLINICAL DATA:  Left lower quadrant pain EXAM: CT ABDOMEN AND PELVIS WITH CONTRAST TECHNIQUE: Multidetector CT imaging of the abdomen and pelvis was performed using the standard protocol following bolus administration of intravenous contrast. CONTRAST:  156m OMNIPAQUE IOHEXOL 300 MG/ML  SOLN COMPARISON:  None. FINDINGS: Lower chest: No acute abnormality. Hepatobiliary: No focal liver abnormality is seen. No gallstones, gallbladder wall thickening, or biliary dilatation. Pancreas: Unremarkable. No pancreatic ductal dilatation or surrounding inflammatory changes. Spleen: Normal in size without focal abnormality. Adrenals/Urinary Tract: Adrenal glands are unremarkable. Kidneys are normal, without renal calculi, focal lesion, or hydronephrosis. Bladder is unremarkable. Stomach/Bowel: Stomach is within normal limits. Appendix is abnormal. Enlarged appendix measuring up to 13 mm. Multiple small stones in the tip of the appendix. Surrounding inflammation. No extraluminal gas. Mild thickening of the adjacent cecum and terminal ileum. No evidence of bowel distention. Vascular/Lymphatic: No significant vascular findings are present. No enlarged abdominal or pelvic lymph nodes. Reproductive: Prostate is unremarkable. Other: Negative for free air or free fluid Musculoskeletal: No acute or significant osseous findings. IMPRESSION: Findings consistent with acute non perforated appendicitis. Appendix: Location: Right lower quadrant Diameter: 14 mm Appendicolith: Multiple present in the tip of the appendix Mucosal hyper-enhancement: Mild mucosal hyperenhancement Extraluminal gas: Negative Periappendiceal collection: Negative Mild thickening of the adjacent cecum and terminal ileum, likely reactive Electronically Signed   By: KDonavan FoilM.D.   On: 12/23/2017 02:45    ROS - all of the below systems have been reviewed with the patient and  positives are indicated with bold text General: chills, fever or night sweats Eyes: blurry vision or double vision ENT: epistaxis or sore throat Allergy/Immunology: itchy/watery eyes or nasal congestion Hematologic/Lymphatic: bleeding problems, blood clots or swollen lymph nodes Endocrine: temperature intolerance or unexpected weight changes Breast: new or changing breast lumps or nipple discharge Resp: cough, shortness of breath, or wheezing CV: chest pain or dyspnea on exertion GI: as per HPI GU: dysuria, trouble voiding, or hematuria MSK: joint pain or joint stiffness Neuro: TIA or stroke symptoms Derm: pruritus and skin lesion changes Psych: anxiety and depression  PE Blood pressure 125/77, pulse 81, temperature 98.4 F (36.9 C), temperature source Oral, resp. rate 18, SpO2 100 %. Constitutional: NAD; conversant; no deformities Eyes: Moist conjunctiva; no lid lag; anicteric; PERRL Neck: Trachea midline; no thyromegaly Lungs: Normal respiratory effort; no  tactile fremitus CV: RRR; no palpable thrills; no pitting edema GI: Abd soft, mildly ttp in right lower quadrant and suprapubic region; nondistended; no palpable hepatosplenomegaly; no rebound/guarding MSK: Normal gait; no clubbing/cyanosis Psychiatric: Appropriate affect; alert and oriented x3 Lymphatic: No palpable cervical or axillary lymphadenopathy  Results for orders placed or performed during the hospital encounter of 12/22/17 (from the past 48 hour(s))  Lipase, blood     Status: None   Collection Time: 12/22/17  8:41 PM  Result Value Ref Range   Lipase 28 11 - 51 U/L    Comment: Performed at [facility], [place] [address], [place], [place] [address]  Comprehensive metabolic panel     Status: Abnormal   Collection Time: 12/22/17  8:41 PM  Result Value Ref Range   Sodium 137 135 - 145 mmol/L   Potassium 3.5 3.5 - 5.1 mmol/L   Chloride 105 101 - 111 mmol/L   CO2 22 22 - 32 mmol/L   Glucose, Bld 100 (H) 65 - 99  mg/dL   BUN 15 6 - 20 mg/dL   Creatinine, Ser 0.69 0.61 - 1.24 mg/dL   Calcium 9.3 8.9 - 10.3 mg/dL   Total Protein 7.7 6.5 - 8.1 g/dL   Albumin 4.5 3.5 - 5.0 g/dL   AST 27 15 - 41 U/L   ALT 34 17 - 63 U/L   Alkaline Phosphatase 66 38 - 126 U/L   Total Bilirubin 0.6 0.3 - 1.2 mg/dL   GFR calc non Af Amer >60 >60 mL/min   GFR calc Af Amer >60 >60 mL/min    Comment: (NOTE) The eGFR has been calculated using the CKD EPI equation. This calculation has not been validated in all clinical situations. eGFR's persistently <60 mL/min signify possible Chronic Kidney Disease.    Anion gap 10 5 - 15    Comment: Performed at [facility] [address], [place], [place] [address]  CBC     Status: None   Collection Time: 12/22/17  8:41 PM  Result Value Ref Range   WBC 10.2 4.0 - 10.5 K/uL   RBC 5.16 4.22 - 5.81 MIL/uL   Hemoglobin 15.1 13.0 - 17.0 g/dL   HCT 45.1 39.0 - 52.0 %   MCV 87.4 78.0 - 100.0 fL   MCH 29.3 26.0 - 34.0 pg   MCHC 33.5 30.0 - 36.0 g/dL   RDW 12.3 11.5 - 15.5 %   Platelets 254 150 - 400 K/uL    Comment: Performed at [facility], [place] [address] [place], [place] [address]  Urinalysis, Routine w reflex microscopic     Status: Abnormal   Collection Time: 12/22/17  8:52 PM  Result Value Ref Range   Color, Urine YELLOW YELLOW   APPearance HAZY (A) CLEAR   Specific Gravity, Urine 1.030 1.005 - 1.030   pH 5.0 5.0 - 8.0   Glucose, UA NEGATIVE NEGATIVE mg/dL   Hgb urine dipstick MODERATE (A) NEGATIVE   Bilirubin Urine NEGATIVE NEGATIVE   Ketones, ur NEGATIVE NEGATIVE mg/dL   Protein, ur NEGATIVE NEGATIVE mg/dL   Nitrite NEGATIVE NEGATIVE   Leukocytes, UA NEGATIVE NEGATIVE   RBC / HPF 0-5 0 - 5 RBC/hpf   WBC, UA 0-5 0 - 5 WBC/hpf   Bacteria, UA NONE SEEN NONE SEEN   Squamous Epithelial / LPF 0-5 0 - 5   Mucus PRESENT    Ca Oxalate Crys, UA PRESENT     Comment: Performed at [facility], [place] [address] [place], [place] [address]    Dg Chest 2  View  Result Date: 12/22/2017 CLINICAL DATA:  Abdominal pain with nonproductive cough several days. EXAM: CHEST - 2 VIEW COMPARISON:  12/28/2014 FINDINGS: Lungs are adequately inflated without focal airspace consolidation or effusion. Cardiomediastinal silhouette, bones and soft tissues are unchanged. IMPRESSION: No active cardiopulmonary disease. Electronically Signed   By: Marin Olp M.D.   On: 12/22/2017 12:30   Ct Abdomen Pelvis W Contrast  Result Date: 12/23/2017 CLINICAL DATA:  Left lower quadrant pain EXAM: CT ABDOMEN AND PELVIS WITH CONTRAST TECHNIQUE: Multidetector CT imaging of the abdomen and pelvis was performed using the standard protocol following bolus administration of intravenous contrast. CONTRAST:  166m OMNIPAQUE IOHEXOL 300 MG/ML  SOLN COMPARISON:  None. FINDINGS: Lower chest: No acute abnormality. Hepatobiliary: No focal liver abnormality is seen. No gallstones, gallbladder wall thickening, or biliary dilatation. Pancreas: Unremarkable. No pancreatic ductal dilatation or surrounding inflammatory changes. Spleen: Normal in size without focal abnormality. Adrenals/Urinary Tract: Adrenal glands are unremarkable. Kidneys are normal, without renal calculi, focal lesion, or hydronephrosis. Bladder is unremarkable. Stomach/Bowel: Stomach is within normal limits. Appendix is abnormal. Enlarged appendix measuring up to 13 mm. Multiple small stones in the tip of the appendix. Surrounding inflammation. No extraluminal gas. Mild thickening of the adjacent cecum and terminal ileum. No evidence of bowel distention. Vascular/Lymphatic: No significant vascular findings are present. No enlarged abdominal or pelvic lymph nodes. Reproductive: Prostate is unremarkable. Other: Negative for free air or free fluid Musculoskeletal: No acute or significant osseous findings. IMPRESSION: Findings consistent with acute non perforated appendicitis. Appendix: Location: Right lower quadrant Diameter: 14 mm  Appendicolith: Multiple present in the tip of the appendix Mucosal hyper-enhancement: Mild mucosal hyperenhancement Extraluminal gas: Negative Periappendiceal collection: Negative Mild thickening of the adjacent cecum and terminal ileum, likely reactive Electronically Signed   By: KDonavan FoilM.D.   On: 12/23/2017 02:45    A/P: Curtis Shellhammeris an 32y.o. male with 2-3d of abdominal pain, recently locazlied to RLQ+suprapubic region; mildly ttp in this area as well. No prior abdominal operations. WBC 10.2. CT shows findings consistent with acute nonperforated appendicitis with a dilated fluid filled 157mappendix with multiple appendicoliths in tip. No surrounding phlegmon or collection. Mild thickening of adjacent TI + cecum most likely reactive  -Will plan laparoscopic appendectomy -The anatomy and physiology of the GI tract was discussed at length with the patient, his wife and daughter. The pathophysiology of appendicitis was discussed at length as well. -We discussed operative and nonoperative approaches to appendicitis. The risks of nonoperative management being up to 40% recurrence rate at 5 years as well as failure to resolve on antibiotics. -The planned procedure, material risks (including, but not limited to, pain, bleeding, infection, scarring, need for blood transfusion, damage to surrounding structures- blood vessels/nerves/viscus/organs, conversion to open operation, damage to bladder, urine leak, leak from appendiceal base, need for additional procedures, hernia, recurrence, pneumonia, heart attack, stroke, death) benefits and alternatives to surgery were discussed at length. I noted a good probability that the procedure would help improve their symptoms. The patient's questions were answered to his satisfaction, he voiced understanding and elected to proceed with surgery. Additionally, we discussed typical postoperative expectations and the recovery process. -I discussed that my  partner will be by to see him later this morning and would be the one performing his surgery.  ChSharon MtWhDema SeverinM.D. [facility]

## 2017-12-23 NOTE — Progress Notes (Signed)
Pt states he  last had something to eat and drink around 1330 12-22-2017

## 2017-12-23 NOTE — Interval H&P Note (Signed)
History and Physical Interval Note:  12/23/2017 9:01 AM  Curtis Petersen  has presented today for surgery, with the diagnosis of APPENDICITIS  The various methods of treatment have been discussed with the patient and family. After consideration of risks, benefits and other options for treatment, the patient has consented to  Procedure(s): LAPAROSCOPIC APPENDECTOMY (N/A) as a surgical intervention .  The patient's history has been reviewed, patient examined, no change in status, stable for surgery.  I have reviewed the patient's chart and labs.  Questions were answered to the patient's satisfaction.    Patient continues to be symptomatic with RLQ pain.  To the OR for laparoscopic appendectomy Jimmye Norman

## 2017-12-23 NOTE — Anesthesia Procedure Notes (Signed)
Procedure Name: Intubation Date/Time: 12/23/2017 9:38 AM Performed by: Babs Bertin, CRNA Pre-anesthesia Checklist: Patient identified, Emergency Drugs available, Suction available and Patient being monitored Patient Re-evaluated:Patient Re-evaluated prior to induction Oxygen Delivery Method: Circle System Utilized Preoxygenation: Pre-oxygenation with 100% oxygen Induction Type: IV induction and Rapid sequence Laryngoscope Size: Mac and 3 Grade View: Grade I Tube type: Oral Tube size: 7.5 mm Number of attempts: 1 Airway Equipment and Method: Stylet and Oral airway Placement Confirmation: ETT inserted through vocal cords under direct vision,  positive ETCO2 and breath sounds checked- equal and bilateral Secured at: 21 cm Tube secured with: Tape Dental Injury: Teeth and Oropharynx as per pre-operative assessment

## 2017-12-23 NOTE — Anesthesia Preprocedure Evaluation (Signed)
Anesthesia Evaluation  Patient identified by MRN, date of birth, ID band Patient awake    Reviewed: Allergy & Precautions, H&P , NPO status , Patient's Chart, lab work & pertinent test results  Airway Mallampati: II   Neck ROM: full    Dental   Pulmonary neg pulmonary ROS,    breath sounds clear to auscultation       Cardiovascular negative cardio ROS   Rhythm:regular Rate:Normal     Neuro/Psych    GI/Hepatic   Endo/Other    Renal/GU      Musculoskeletal   Abdominal   Peds  Hematology   Anesthesia Other Findings   Reproductive/Obstetrics                             Anesthesia Physical Anesthesia Plan  ASA: I  Anesthesia Plan: General   Post-op Pain Management:    Induction: Intravenous  PONV Risk Score and Plan: 2 and Ondansetron, Dexamethasone, Midazolam and Treatment may vary due to age or medical condition  Airway Management Planned: Oral ETT  Additional Equipment:   Intra-op Plan:   Post-operative Plan: Extubation in OR  Informed Consent: I have reviewed the patients History and Physical, chart, labs and discussed the procedure including the risks, benefits and alternatives for the proposed anesthesia with the patient or authorized representative who has indicated his/her understanding and acceptance.       Plan Discussed with: CRNA, Anesthesiologist and Surgeon  Anesthesia Plan Comments:         Anesthesia Quick Evaluation  

## 2017-12-23 NOTE — ED Provider Notes (Signed)
[facility] EMERGENCY DEPARTMENT Provider Note   CSN: [id number] Arrival date & time: 12/22/17  2027     History   Chief Complaint Chief Complaint  Patient presents with  . Abdominal Pain    HPI Curtis Petersen is a 32 y.o. male.  Patient with no pertinent past medical history presents to the emergency department with a chief complaint of right lower quadrant abdominal pain.  He reports that he had left lower quadrant pain throughout the day, but it has moved to his right lower quadrant now.  He reports subjective fevers and chills.  Reports some associated nausea and vomiting.  He denies any cough.  He is not taking anything for his symptoms.  He was seen yesterday and reported cough at that time, but had a negative chest x-ray.  He denies any dysuria.  Denies any other associated symptoms.  The history is provided by the patient. The history is limited by a language barrier. A language interpreter was used.    History reviewed. No pertinent past medical history.  There are no active problems to display for this patient.   History reviewed. No pertinent surgical history.      Home Medications    Prior to Admission medications   Medication Sig Start Date End Date Taking? Authorizing Provider  azithromycin (ZITHROMAX Z-PAK) 250 MG tablet 2 po day one, then 1 daily x 4 days Patient not taking: Reported on 12/23/2017 12/28/14   Arthor Captain, PA-C  benzonatate (TESSALON) 100 MG capsule Take 1 capsule (100 mg total) by mouth 3 (three) times daily as needed for cough. 12/22/17   Ward, Chase Picket, PA-C  guaiFENesin (MUCINEX) 600 MG 12 hr tablet Take 2 tablets (1,200 mg total) by mouth 2 (two) times daily. Patient not taking: Reported on 12/23/2017 12/28/14   Arthor Captain, PA-C  guaiFENesin-codeine 100-10 MG/5ML syrup Take 5-10 mLs by mouth every 6 (six) hours as needed for cough. Patient not taking: Reported on 12/23/2017 12/28/14   Arthor Captain, PA-C    hydrOXYzine (ATARAX/VISTARIL) 25 MG tablet Take 1 tablet (25 mg total) by mouth every 6 (six) hours. Patient not taking: Reported on 12/23/2017 01/03/16   Gilda Crease, MD  predniSONE (DELTASONE) 20 MG tablet 3 tabs po daily x 3 days, then 2 tabs x 3 days, then 1.5 tabs x 3 days, then 1 tab x 3 days, then 0.5 tabs x 3 days Patient not taking: Reported on 12/23/2017 01/03/16   Gilda Crease, MD    Family History No family history on file.  Social History Social History   Tobacco Use  . Smoking status: Never Smoker  . Smokeless tobacco: Never Used  Substance Use Topics  . Alcohol use: No  . Drug use: No     Allergies   Patient has no known allergies.   Review of Systems Review of Systems  All other systems reviewed and are negative.    Physical Exam Updated Vital Signs BP 125/77 (BP Location: Right Arm)   Pulse 81   Temp 98.4 F (36.9 C) (Oral)   Resp 18   SpO2 100%   Physical Exam  Constitutional: He is oriented to person, place, and time. He appears well-developed and well-nourished.  HENT:  Head: Normocephalic and atraumatic.  Eyes: Pupils are equal, round, and reactive to light. Conjunctivae and EOM are normal. Right eye exhibits no discharge. Left eye exhibits no discharge. No scleral icterus.  Neck: Normal range of motion. Neck supple. No  JVD present.  Cardiovascular: Normal rate, regular rhythm and normal heart sounds. Exam reveals no gallop and no friction rub.  No murmur heard. Pulmonary/Chest: Effort normal and breath sounds normal. No respiratory distress. He has no wheezes. He has no rales. He exhibits no tenderness.  Abdominal: Soft. He exhibits no distension and no mass. There is tenderness in the right lower quadrant and left lower quadrant. There is no rebound and no guarding.  Musculoskeletal: Normal range of motion. He exhibits no edema or tenderness.  Neurological: He is alert and oriented to person, place, and time.  Skin: Skin is  warm and dry.  Psychiatric: He has a normal mood and affect. His behavior is normal. Judgment and thought content normal.  Nursing note and vitals reviewed.    ED Treatments / Results  Labs (all labs ordered are listed, but only abnormal results are displayed) Labs Reviewed  COMPREHENSIVE METABOLIC PANEL - Abnormal; Notable for the following components:      Result Value   Glucose, Bld 100 (*)    All other components within normal limits  URINALYSIS, ROUTINE W REFLEX MICROSCOPIC - Abnormal; Notable for the following components:   APPearance HAZY (*)    Hgb urine dipstick MODERATE (*)    All other components within normal limits  LIPASE, BLOOD  CBC    EKG None  Radiology Dg Chest 2 View  Result Date: 12/22/2017 CLINICAL DATA:  Abdominal pain with nonproductive cough several days. EXAM: CHEST - 2 VIEW COMPARISON:  12/28/2014 FINDINGS: Lungs are adequately inflated without focal airspace consolidation or effusion. Cardiomediastinal silhouette, bones and soft tissues are unchanged. IMPRESSION: No active cardiopulmonary disease. Electronically Signed   By: Elberta Fortis M.D.   On: 12/22/2017 12:30    Procedures Procedures (including critical care time)  Medications Ordered in ED Medications  morphine 4 MG/ML injection 4 mg (has no administration in time range)  ondansetron (ZOFRAN) injection 4 mg (has no administration in time range)     Initial Impression / Assessment and Plan / ED Course  I have reviewed the triage vital signs and the nursing notes.  Pertinent labs & imaging results that were available during my care of the patient were reviewed by me and considered in my medical decision making (see chart for details).     Patient with generalized abdominal pain.  Symptoms started yesterday.  Started in the left abdomen and is now radiating toward the right.  He states that it significantly worsened today.  He reports subjective fevers and chills at home.  Reports some  nausea and vomiting.  CT today is consistent with acute appendicitis without perforation.  White count is 10.5, but up from yesterday's visit when he was seen for cough.  Discussed with Dr. Cliffton Asters, from general surgery, who will see the patient.  Final Clinical Impressions(s) / ED Diagnoses   Final diagnoses:  Appendicitis, acute, with generalized peritonitis    ED Discharge Orders    None       Roxy Horseman, PA-C 12/23/17 0303    Zadie Rhine, MD 12/23/17 [phone]

## 2017-12-23 NOTE — Discharge Instructions (Signed)
CIRUGIA LAPAROSCOPICA: INSTRUCCIONES DE POST OPERATORIO. ° °Revise siempre los documentos que le entreguen en el lugar donde se ha hecho la cirugia. ° °SI USTED NECESITA DOCUMENTOS DE INCAPACIDAD (DISABLE) O DE PERMISO FAMILAR (FAMILY LEAVE) NECESITA TRAERLOS A LA OFICINA PARA QUE SEAN PROCESADOS. °NO  SE LOS DE A SU DOCTOR. °1. A su alta del hospital se le dara una receta para controlar el dolor. Tomela como ha sido recetada, si la necesita. Si no la necesita puede tomar, Acetaminofen (Tylenol) o Ibuprofen (Advil) para aliviar dolor moderado. °2. Continue tomando el resto de sus medicinas. °3. Si necesita rellenar la receta, llame a la farmacia. ellos contactan a nuestra oficina pidiendo autorizacion. Este tipo de receta no pueden ser rellenadas despues de las  5pm o durante los fines de semana. °4. Con relacion a la dieta: debe ser ligera los primeros dias despues que llege a la casa. Ejemplo: sopas y galleticas. Tome bastante liquido esos dias. °5. La mayoria de los pacientes padecen de inflamacion y cambio de coloracion de la piel alrededor de las incisiones. esto toma dias en resolver.  pnerse una bolsa de hielo en el area affectada ayuda..  °6. Es comun tambien tener un poco de estrenimiento si esta tomado medicinas para el dolor. incremente la cantidad de liquidos a tomar y puede tomar (Colace) esto previene el problema. Si ya tiene estrenimiento, es decir no ha defecado en 48 horas, puede tomar un laxativo (Milk of Magnesia or Miralax) uselo como el paquete le explica. °7.  A menos que se le diga algo diferente. Remueva el bendaje a las 24-48 horas despues dela cirugia. y puede banarse en la ducha sin ningun problema. usted puede tener steri-strips (pequenas curitas transparentes en la piel puesta encima de la incision)  Estas banditas strips should be left on the skin for 7-10 days.   Si su cirujano puso pegamento encima de la incision usted puede banarse bajo la ducha en 24 horas. Este pegamento empezara a  caerse en las proximas 2-3 semanas. Si le pusieron suturas o presillas (grapos) estos seran quitados en su proxima cita en la oficina. . °a. ACTIVIDADES:  Puede hacer actividad ligera.  Como caminar , subir escaleras y poco a poco irlas incrementando tanto como las tolere. Puede tener relaciones sexuales cuando sea comfortable. No carge objetos pesados o haga esfuerzos que no sean aprovados por su doctor. °b. Puede manejar en cuanto no esta tomando medicamentos fuertes (narcoticos) para el dolor, pueda abrochar confortablemente el cinturon de seguridad, y pueda maniobrar y usar los pedales de su vehiculo con seguridad. °c. PUEDE REGRESAR A TRABAJAR  °8. Debe ver a su doctor para una cita de seguimiento en 2-3 semanas despues de la cirugia.  °9. OTRAS ISNSTRUCCIONES:___________________________________________________________________________________ °CUANDO LLAMAR A SU MEDICO: °1. FIEBRE mayor de  101.0 °2. No produccion de orina. °3. Sangramiento continue de la herida °4. Incremento de dolor, enrojecimientio o drenaje de la herida (incision) °5. Incremento de dolor abdominal. ° °The clinic staff is available to answer your questions during regular business hours.  Please don’t hesitate to call and ask to speak to one of the nurses for clinical concerns.  If you have a medical emergency, go to the nearest emergency room or call 911.  A surgeon from [facility] is always on call at the hospital. °[address], [address], [place], [place]  [address] ? [address] [address], [place], [place]   [address] °[phone] ? [phone] ? FAX [phone] °Web site: [email or web address] ° ° °

## 2017-12-23 NOTE — Op Note (Signed)
OPERATIVE REPORT  DATE OF OPERATION: 12/22/2017 - 12/23/2017  PATIENT:  Curtis Petersen  32 y.o. male  PRE-OPERATIVE DIAGNOSIS:  APPENDICITIS  POST-OPERATIVE DIAGNOSIS:  APPENDICITIS  INDICATION(S) FOR OPERATION:  Acute appendicitis   FINDINGS:  Fat appendix at the base, no perforation.  PROCEDURE:  Procedure(s): LAPAROSCOPIC APPENDECTOMY  SURGEON:  Surgeon(s): Jimmye Norman, MD  ASSISTANT: NOne  ANESTHESIA:   general  COMPLICATIONS:  None  EBL: <10 ml  BLOOD ADMINISTERED: none  DRAINS: none   SPECIMEN:  Source of Specimen:  Appendix  COUNTS CORRECT:  YES  PROCEDURE DETAILS: The patient was taken to the operating room and placed on the table in the supine position.  After adequate general endotracheal anesthetic was administered, he was prepped and draped in usual sterile manner exposing his entire abdomen.  A proper timeout was performed identifying the patient and the procedure to be performed.  We started with a supraumbilical midline incision down to the midline fascia.  We incised the midline fascia using 15 blade and bluntly dissected down into the peritoneal cavity with a Kelly clamp.  Once this was done a pursestring suture of 0 Vicryl was passed around the fascial opening.  A Hassan cannula was passed through the fascial opening into the peritoneal cavity through which carbon dioxide gas was insufflated up to a maximal intra-abdominal pressure of 15 mmHg.  There is pursestring suture held the [place] cannula in place.  We placed 2 5 mm cannulas, one in the right upper quadrant and one in the left lower quadrant and then placed the patient in Trendelenburg position with the left side tilted down.  The appendix could be easily seen in the right lower quadrant it was flat mainly at the base of the cecum.  We mobilized it taken down the mesoappendix using a harmonic scalpel Calot denies get to the base of the cecum.  We subsequently used a blue cartridge Endo GIA stapler  to come across the very fat base of the cecum and appendix detachment.  There was somewhat of a palpable crunches and came across the base of the cecum.  We subsequently collected the appendix in the Endo Catch bag and removed from the peritoneal cavity.  We inspected the area of the appendix and cecum for bleeding and none was noted.  We irrigated with saline and then subsequently placed the patient back in the neutral position.  We aspirated all fluid and gas and removed all cannulas and removed a light source.  All needle counts, sponge counts, and instrument counts were correct.  We closed the supraumbilical fascial site with the pursestring suture which was in place.  We injected 0.25% Marcaine at all sites this is Marcaine with epinephrine.  Closed the skin at the subumbilical site using a running subcuticular stitch of 4-0 Monocryl.  Dermabond, Steri-Strips, and Tegaderm were used to complete all dressings.  PATIENT DISPOSITION:  PACU - hemodynamically stable.   Jimmye Norman 5/20/201910:25 AM

## 2017-12-23 NOTE — Discharge Summary (Signed)
[facility] Discharge Summary   Patient ID: Curtis Petersen MRN: [id number] DOB/AGE: [birth year] 32 y.o.  Admit date: 12/22/2017 Discharge date: 12/24/2017 Admitting Diagnosis: Acute appendicitis  Discharge Diagnosis Patient Active Problem List   Diagnosis Date Noted  . Acute appendicitis 12/23/2017    Consultants None  Imaging: Dg Chest 2 View  Result Date: 12/22/2017 CLINICAL DATA:  Abdominal pain with nonproductive cough several days. EXAM: CHEST - 2 VIEW COMPARISON:  12/28/2014 FINDINGS: Lungs are adequately inflated without focal airspace consolidation or effusion. Cardiomediastinal silhouette, bones and soft tissues are unchanged. IMPRESSION: No active cardiopulmonary disease. Electronically Signed   By: Elberta Fortis M.D.   On: 12/22/2017 12:30   Ct Abdomen Pelvis W Contrast  Result Date: 12/23/2017 CLINICAL DATA:  Left lower quadrant pain EXAM: CT ABDOMEN AND PELVIS WITH CONTRAST TECHNIQUE: Multidetector CT imaging of the abdomen and pelvis was performed using the standard protocol following bolus administration of intravenous contrast. CONTRAST:  OMNIPAQUE IOHEXOL 300 MG/ML  SOLN COMPARISON:  None. FINDINGS: Lower chest: No acute abnormality. Hepatobiliary: No focal liver abnormality is seen. No gallstones, gallbladder wall thickening, or biliary dilatation. Pancreas: Unremarkable. No pancreatic ductal dilatation or surrounding inflammatory changes. Spleen: Normal in size without focal abnormality. Adrenals/Urinary Tract: Adrenal glands are unremarkable. Kidneys are normal, without renal calculi, focal lesion, or hydronephrosis. Bladder is unremarkable. Stomach/Bowel: Stomach is within normal limits. Appendix is abnormal. Enlarged appendix measuring up to 13 mm. Multiple small stones in the tip of the appendix. Surrounding inflammation. No extraluminal gas. Mild thickening of the adjacent cecum and terminal ileum. No evidence of bowel distention.  Vascular/Lymphatic: No significant vascular findings are present. No enlarged abdominal or pelvic lymph nodes. Reproductive: Prostate is unremarkable. Other: Negative for free air or free fluid Musculoskeletal: No acute or significant osseous findings. IMPRESSION: Findings consistent with acute non perforated appendicitis. Appendix: Location: Right lower quadrant Diameter: 14 mm Appendicolith: Multiple present in the tip of the appendix Mucosal hyper-enhancement: Mild mucosal hyperenhancement Extraluminal gas: Negative Periappendiceal collection: Negative Mild thickening of the adjacent cecum and terminal ileum, likely reactive Electronically Signed   By: Jasmine Pang M.D.   On: 12/23/2017 02:45    Procedures Dr. Lindie Spruce (12/23/17) - Laparoscopic Appendectomy  Hospital Course:  Patient is a 32 year old male who presented to [facility] with abdominal pain.  Workup showed acute appendicitis.  Patient was admitted and underwent procedure listed above.  Tolerated procedure well and was transferred to the floor.  Diet was advanced as tolerated.  On POD#1, the patient was voiding well, tolerating diet, ambulating well, pain well controlled, vital signs stable, incisions c/d/i and felt stable for discharge home.  Patient will follow up in our office in 2 weeks and knows to call with questions or concerns.  He will call to confirm appointment date/time.    Physical Exam: General:  Alert, NAD, pleasant, comfortable Abd:  Soft, ND, mild tenderness, incisions C/D/I  I attempted to look this patient up in the [place] Controlled Substance Database but no matching record was found.   Allergies as of 12/24/2017   No Known Allergies     Medication List    STOP taking these medications   azithromycin 250 MG tablet Commonly known as:  ZITHROMAX Z-PAK   guaiFENesin 600 MG 12 hr tablet Commonly known as:  MUCINEX   guaiFENesin-codeine 100-10 MG/5ML syrup   hydrOXYzine 25 MG tablet Commonly known as:  ATARAX/VISTARIL    predniSONE 20 MG tablet Commonly known as:  DELTASONE  TAKE these medications   acetaminophen 500 MG tablet Commonly known as:  TYLENOL Take 2 tablets (1,000 mg total) by mouth every 8 (eight) hours as needed for mild pain.   benzonatate 100 MG capsule Commonly known as:  TESSALON Take 1 capsule (100 mg total) by mouth 3 (three) times daily as needed for cough.   ibuprofen 200 MG tablet Commonly known as:  MOTRIN IB Take 3 tablets (600 mg total) by mouth every 6 (six) hours as needed for mild pain.   traMADol 50 MG tablet Commonly known as:  ULTRAM Take 1 tablet (50 mg total) by mouth every 6 (six) hours as needed (pain not controlled with tylenol and ibuprofen).        Follow-up Information    [facility] Go on 01/07/2018.   Specialty:  General Surgery Why:  Your follow up appointment is scheduled for 9:45 AM. Please arrive 30 min prior to appointment time. Bring photo ID and insurance information.  Contact information: [address] [place] [address] [phone]           Signed: Wells Guiles, [facility] 12/24/2017, 8:25 AM Pager: [phone] Consults: [phone] Mon-Fri 7:00 am-4:30 pm Sat-Sun 7:00 am-11:30 am

## 2017-12-24 ENCOUNTER — Encounter (HOSPITAL_COMMUNITY): Payer: Self-pay | Admitting: General Surgery

## 2017-12-24 LAB — HIV ANTIBODY (ROUTINE TESTING W REFLEX): HIV SCREEN 4TH GENERATION: NONREACTIVE

## 2017-12-24 MED ORDER — TRAMADOL HCL 50 MG PO TABS: 50.0000 mg | ORAL_TABLET | Freq: Four times a day (QID) | ORAL | 0 refills | Status: DC | PRN

## 2017-12-24 MED ORDER — ACETAMINOPHEN 500 MG PO TABS: 1000.0000 mg | ORAL_TABLET | Freq: Three times a day (TID) | ORAL | 0 refills | Status: AC | PRN

## 2017-12-24 MED ORDER — IBUPROFEN 200 MG PO TABS: 600.0000 mg | ORAL_TABLET | Freq: Four times a day (QID) | ORAL | 2 refills | Status: AC | PRN

## 2017-12-24 MED ORDER — SODIUM CHLORIDE 0.9 % IV SOLN
2.0000 g | Freq: Once | INTRAVENOUS | Status: AC
  Administered 2017-12-24: 2 g via INTRAVENOUS
  Filled 2017-12-24 (×2): qty 20

## 2017-12-24 NOTE — Progress Notes (Signed)
Discharge instructions reviewed with pt and his wife.  Pt given his discharge instructions in his native language. Pt had prescription called to wrong pharmacy - [facility]. Pt aware of the correction of pharmacy Pt to f/u with [facility] on 01/07/2018 .  Pt has his work Physicist, medical.

## 2017-12-24 NOTE — Anesthesia Postprocedure Evaluation (Signed)
Anesthesia Post Note  Patient: Curtis Petersen  Procedure(s) Performed: LAPAROSCOPIC APPENDECTOMY (N/A Abdomen)     Patient location during evaluation: PACU Anesthesia Type: General Level of consciousness: awake and alert Pain management: pain level controlled Vital Signs Assessment: post-procedure vital signs reviewed and stable Respiratory status: spontaneous breathing, nonlabored ventilation, respiratory function stable and patient connected to nasal cannula oxygen Cardiovascular status: blood pressure returned to baseline and stable Postop Assessment: no apparent nausea or vomiting Anesthetic complications: no    Last Vitals:  Vitals:   12/24/17 0238 12/24/17 0422  BP: 114/61 114/66  Pulse: 84 72  Resp: 18 16  Temp: 36.8 C 36.7 C  SpO2: 97% 97%    Last Pain:  Vitals:   12/24/17 0710  TempSrc:   PainSc: 3                  [name] S

## 2018-01-26 ENCOUNTER — Encounter (HOSPITAL_COMMUNITY): Payer: Self-pay | Admitting: Emergency Medicine

## 2018-01-26 ENCOUNTER — Emergency Department (HOSPITAL_COMMUNITY)
Admission: EM | Admit: 2018-01-26 | Discharge: 2018-01-27 | Disposition: A | Discharge status: Home / Self Care | Payer: Self-pay | Attending: Emergency Medicine | Admitting: Emergency Medicine

## 2018-01-26 ENCOUNTER — Other Ambulatory Visit: Payer: Self-pay

## 2018-01-26 DIAGNOSIS — G8918 Other acute postprocedural pain: Secondary | ICD-10-CM | POA: Insufficient documentation

## 2018-01-26 DIAGNOSIS — Z79899 Other long term (current) drug therapy: Secondary | ICD-10-CM | POA: Insufficient documentation

## 2018-01-26 DIAGNOSIS — R109 Unspecified abdominal pain: Secondary | ICD-10-CM | POA: Insufficient documentation

## 2018-01-26 NOTE — ED Triage Notes (Signed)
Pt states he had appendectomy 1 month ago.  C/o pain, itching, and redness to surgical site.  Denies fever and chills.

## 2018-01-27 ENCOUNTER — Emergency Department (HOSPITAL_COMMUNITY): Payer: Self-pay

## 2018-01-27 LAB — CBC
HCT: 44.4 % (ref 39.0–52.0)
HEMOGLOBIN: 14.7 g/dL (ref 13.0–17.0)
MCH: 29.5 pg (ref 26.0–34.0)
MCHC: 33.1 g/dL (ref 30.0–36.0)
MCV: 89.2 fL (ref 78.0–100.0)
Platelets: 271 10*3/uL (ref 150–400)
RBC: 4.98 MIL/uL (ref 4.22–5.81)
RDW: 12.5 % (ref 11.5–15.5)
WBC: 6.6 10*3/uL (ref 4.0–10.5)

## 2018-01-27 LAB — BASIC METABOLIC PANEL
ANION GAP: 9 (ref 5–15)
BUN: 17 mg/dL (ref 6–20)
CHLORIDE: 103 mmol/L (ref 101–111)
CO2: 26 mmol/L (ref 22–32)
CREATININE: 0.74 mg/dL (ref 0.61–1.24)
Calcium: 9.4 mg/dL (ref 8.9–10.3)
GFR calc non Af Amer: >60 mL/min (ref >60)
Glucose, Bld: 103 mg/dL — ABNORMAL HIGH (ref 65–99)
POTASSIUM: 3.9 mmol/L (ref 3.5–5.1)
SODIUM: 138 mmol/L (ref 135–145)

## 2018-01-27 MED ORDER — IOPAMIDOL (ISOVUE-300) INJECTION 61%
30.0000 mL | Freq: Once | INTRAVENOUS | Status: AC | PRN
  Administered 2018-01-27: 30 mL via ORAL

## 2018-01-27 MED ORDER — IOHEXOL 300 MG/ML  SOLN
100.0000 mL | Freq: Once | INTRAMUSCULAR | Status: AC | PRN
  Administered 2018-01-27: 100 mL via INTRAVENOUS

## 2018-01-27 MED ORDER — MORPHINE SULFATE (PF) 4 MG/ML IV SOLN
4.0000 mg | Freq: Once | INTRAVENOUS | Status: AC
  Administered 2018-01-27: 4 mg via INTRAVENOUS
  Filled 2018-01-27: qty 1

## 2018-01-27 MED ORDER — IOPAMIDOL (ISOVUE-300) INJECTION 61%
INTRAVENOUS | Status: AC
  Filled 2018-01-27: qty 30

## 2018-01-27 NOTE — ED Provider Notes (Signed)
[facility] Provider Note   CSN: [id number] Arrival date & time: 01/26/18  2336     History   Chief Complaint Chief Complaint  Patient presents with  . Incisional Pain    HPI Curtis Petersen is a 32 y.o. male.  HPI Patient is a 32 year old male who underwent appendectomy on Dec 23, 2017.  He states over the past month he has had incisional and periumbilical abdominal pain without fever.  Denies nausea vomiting.  No diarrhea.  He states he did not follow-up with [facility] [place] [facility] postoperatively.  He states his pain is worse with movement while at work.  Symptoms are mild in severity at this time.  He also feels like the surgical site is itching and slightly red.   History reviewed. No pertinent past medical history.  Patient Active Problem List   Diagnosis Date Noted  . Acute appendicitis 12/23/2017    Past Surgical History:  Procedure Laterality Date  . LAPAROSCOPIC APPENDECTOMY N/A 12/23/2017   Procedure: LAPAROSCOPIC APPENDECTOMY;  Surgeon: Jimmye NormanWyatt, James, MD;  Location: [facility];  Service: General;  Laterality: N/A;        Home Medications    Prior to Admission medications   Medication Sig Start Date End Date Taking? Authorizing Provider  acetaminophen (TYLENOL) 500 MG tablet Take 2 tablets (1,000 mg total) by mouth every 8 (eight) hours as needed for mild pain. 12/24/17   Rayburn, Alphonsus SiasKelly A, PA-C  benzonatate (TESSALON) 100 MG capsule Take 1 capsule (100 mg total) by mouth 3 (three) times daily as needed for cough. 12/22/17   Ward, Chase PicketJaime Pilcher, PA-C  ibuprofen (MOTRIN IB) 200 MG tablet Take 3 tablets (600 mg total) by mouth every 6 (six) hours as needed for mild pain. 12/24/17 12/24/18  Rayburn, Alphonsus SiasKelly A, PA-C  traMADol (ULTRAM) 50 MG tablet Take 1 tablet (50 mg total) by mouth every 6 (six) hours as needed (pain not controlled with tylenol and ibuprofen). 12/24/17   Rayburn, Alphonsus SiasKelly A, PA-C    Family History No family history  on file.  Social History Social History   Tobacco Use  . Smoking status: Never Smoker  . Smokeless tobacco: Never Used  Substance Use Topics  . Alcohol use: No  . Drug use: No     Allergies   Patient has no known allergies.   Review of Systems Review of Systems  All other systems reviewed and are negative.    Physical Exam Updated Vital Signs BP (!) 103/54   Pulse 64   Temp 98.8 F (37.1 C)   Resp 18   SpO2 96%   Physical Exam  Constitutional: He is oriented to person, place, and time. He appears well-developed and well-nourished.  HENT:  Head: Normocephalic.  Eyes: EOM are normal.  Neck: Normal range of motion.  Cardiovascular: Normal rate and regular rhythm.  Pulmonary/Chest: Effort normal and breath sounds normal.  Abdominal: Soft. He exhibits no distension.  Mild periumbilical abdominal tenderness.  No erythema of the midline and periumbilical incisions.  The redness he is describing is more the fresh pink scar tissue.  Musculoskeletal: Normal range of motion.  Neurological: He is alert and oriented to person, place, and time.  Psychiatric: He has a normal mood and affect.  Nursing note and vitals reviewed.    ED Treatments / Results  Labs (all labs ordered are listed, but only abnormal results are displayed) Labs Reviewed  BASIC METABOLIC PANEL - Abnormal; Notable for the following components:  Result Value   Glucose, Bld 103 (*)    All other components within normal limits  CBC    EKG None  Radiology Ct Abdomen Pelvis W Contrast  Result Date: 01/27/2018 CLINICAL DATA:  32 year old male with abdominal pain. EXAM: CT ABDOMEN AND PELVIS WITH CONTRAST TECHNIQUE: Multidetector CT imaging of the abdomen and pelvis was performed using the standard protocol following bolus administration of intravenous contrast. CONTRAST:  30mL ISOVUE-300 IOPAMIDOL (ISOVUE-300) INJECTION 61%, OMNIPAQUE IOHEXOL 300 MG/ML SOLN COMPARISON:  CT of the abdomen  pelvis dated 12/23/2017 FINDINGS: Lower chest: The visualized lung bases are clear. No intra-abdominal free air or free fluid. Hepatobiliary: Probable fatty infiltration of the liver. No intrahepatic biliary ductal dilatation. The gallbladder is unremarkable. Pancreas: Unremarkable. No pancreatic ductal dilatation or surrounding inflammatory changes. Spleen: Normal in size without focal abnormality. Adrenals/Urinary Tract: The adrenal glands are unremarkable. The kidneys, visualized ureters, and urinary bladder appear unremarkable. Stomach/Bowel: There are small scattered sigmoid diverticula without active inflammatory changes. There is no bowel obstruction. Minimal thickened appearance of multiple loops of small bowel may be related to underdistention or less likely enteritis. Clinical correlation is recommended. Appendectomy. Vascular/Lymphatic: No significant vascular findings are present. No enlarged abdominal or pelvic lymph nodes. Reproductive: The prostate and seminal vesicles are grossly unremarkable. Other: None Musculoskeletal: No acute or significant osseous findings. IMPRESSION: 1. Underdistention versus less likely mild enteritis. Clinical correlation is recommended. No bowel obstruction. 2. Small sigmoid diverticula without active inflammatory changes. Electronically Signed   By: Elgie Collard M.D.   On: 01/27/2018 03:18    Procedures Procedures (including critical care time)  Medications Ordered in ED Medications  iopamidol (ISOVUE-300) 61 % injection (has no administration in time range)  morphine 4 MG/ML injection 4 mg (4 mg Intravenous Given 01/27/18 0111)  iopamidol (ISOVUE-300) 61 % injection 30 mL (30 mLs Oral Contrast Given 01/27/18 0030)  iohexol (OMNIPAQUE) 300 MG/ML solution 100 mL (100 mLs Intravenous Contrast Given 01/27/18 0301)     Initial Impression / Assessment and Plan / ED Course  I have reviewed the triage vital signs and the nursing notes.  Pertinent labs &  imaging results that were available during my care of the patient were reviewed by me and considered in my medical decision making (see chart for details).    Labs and CT imaging performed.  CT imaging without acute intra-abdominal postoperative complication.  No signs to suggest cellulitis at this time.  Patient with some sort of incisional related pain.  General surgery follow-up for postop visit.  No other complaints.  Patient understands he will need to call the office for follow-up  Final Clinical Impressions(s) / ED Diagnoses   Final diagnoses:  Acute abdominal pain  Post-operative pain    ED Discharge Orders    None       Azalia Bilis, MD 01/27/18 [phone]

## 2018-01-27 NOTE — ED Notes (Signed)
Patient transported to CT 

## 2018-01-27 NOTE — Discharge Instructions (Addendum)
Call your surgical team for follow up

## 2019-06-10 ENCOUNTER — Emergency Department (HOSPITAL_COMMUNITY)
Admission: EM | Admit: 2019-06-10 | Discharge: 2019-06-10 | Disposition: A | Discharge status: Home / Self Care | Payer: Self-pay | Attending: Emergency Medicine | Admitting: Emergency Medicine

## 2019-06-10 ENCOUNTER — Encounter (HOSPITAL_COMMUNITY): Payer: Self-pay | Admitting: Emergency Medicine

## 2019-06-10 ENCOUNTER — Other Ambulatory Visit: Payer: Self-pay

## 2019-06-10 DIAGNOSIS — R21 Rash and other nonspecific skin eruption: Secondary | ICD-10-CM | POA: Insufficient documentation

## 2019-06-10 DIAGNOSIS — L299 Pruritus, unspecified: Secondary | ICD-10-CM | POA: Insufficient documentation

## 2019-06-10 MED ORDER — CLOTRIMAZOLE 1 % EX CREA: TOPICAL_CREAM | CUTANEOUS | 0 refills | Status: AC

## 2019-06-10 NOTE — Discharge Instructions (Signed)
Please read attached information. If you experience any new or worsening signs or symptoms please return to the emergency room for evaluation. Please follow-up with your primary care provider or specialist as discussed. Please use medication prescribed only as directed and discontinue taking if you have any concerning signs or symptoms.   °

## 2019-06-10 NOTE — ED Provider Notes (Signed)
[facility] EMERGENCY DEPARTMENT Provider Note   CSN: [id number] Arrival date & time: 06/10/19  1215     History   Chief Complaint Chief Complaint  Patient presents with  . Skin Issue    HPI Rishikesh Khachatryan is a 33 y.o. male.     HPI video Spanish interpreter used  33 year old male presents today with complaints of rash.  Patient notes that over the last 3 months he has had a rash to his pelvic region face and scalp.  This is coming and going itchy and scaly.  He notes he is sexually active with 1 male partner his wife who has had recent fungal infections.  He has not tried any medications for this previously.  History reviewed. No pertinent past medical history.  Patient Active Problem List   Diagnosis Date Noted  . Acute appendicitis 12/23/2017    Past Surgical History:  Procedure Laterality Date  . LAPAROSCOPIC APPENDECTOMY N/A 12/23/2017   Procedure: LAPAROSCOPIC APPENDECTOMY;  Surgeon: Judeth Horn, MD;  Location: [facility];  Service: General;  Laterality: N/A;        Home Medications    Prior to Admission medications   Medication Sig Start Date End Date Taking? Authorizing Provider  acetaminophen (TYLENOL) 500 MG tablet Take 2 tablets (1,000 mg total) by mouth every 8 (eight) hours as needed for mild pain. 12/24/17   Rayburn, Floyce Stakes, PA-C  benzonatate (TESSALON) 100 MG capsule Take 1 capsule (100 mg total) by mouth 3 (three) times daily as needed for cough. 12/22/17   Ward, Ozella Almond, PA-C  clotrimazole (LOTRIMIN) 1 % cream Apply to affected area 2 times daily 06/10/19   Eugena Rhue, Dellis Filbert, PA-C  traMADol (ULTRAM) 50 MG tablet Take 1 tablet (50 mg total) by mouth every 6 (six) hours as needed (pain not controlled with tylenol and ibuprofen). 12/24/17   Rayburn, Floyce Stakes, PA-C    Family History No family history on file.  Social History Social History   Tobacco Use  . Smoking status: Never Smoker  . Smokeless tobacco: Never Used  Substance  Use Topics  . Alcohol use: No  . Drug use: No     Allergies   Patient has no known allergies.   Review of Systems Review of Systems  All other systems reviewed and are negative.   Physical Exam Updated Vital Signs BP 123/77   Pulse 74   Temp 98.4 F (36.9 C) (Oral)   Resp 16   SpO2 96%   Physical Exam Vitals signs and nursing note reviewed.  Constitutional:      Appearance: He is well-developed.  HENT:     Head: Normocephalic and atraumatic.  Eyes:     General: No scleral icterus.       Right eye: No discharge.        Left eye: No discharge.     Conjunctiva/sclera: Conjunctivae normal.     Pupils: Pupils are equal, round, and reactive to light.  Neck:     Musculoskeletal: Normal range of motion.     Vascular: No JVD.     Trachea: No tracheal deviation.  Pulmonary:     Effort: Pulmonary effort is normal.     Breath sounds: No stridor.  Skin:    Comments: Erythematous circular lesions with dry scaling overlying them noted to the face and scalp, no hair loss, he has 2 lesions noted on his penis as well  Neurological:     Mental Status: He is alert and  oriented to person, place, and time.     Coordination: Coordination normal.  Psychiatric:        Behavior: Behavior normal.        Thought Content: Thought content normal.        Judgment: Judgment normal.      ED Treatments / Results  Labs (all labs ordered are listed, but only abnormal results are displayed) Labs Reviewed - No data to display  EKG None  Radiology No results found.  Procedures Procedures (including critical care time)  Medications Ordered in ED Medications - No data to display   Initial Impression / Assessment and Plan / ED Course  I have reviewed the triage vital signs and the nursing notes.  Pertinent labs & imaging results that were available during my care of the patient were reviewed by me and considered in my medical decision making (see chart for details).         33 year old male presents today with rash.  Concern for fungal infection.  This is been ongoing for 3 months.  I discussed with the patient that I would feel comfortable starting him on topical antifungal for the facial and penile lesions but he would need close follow-up with primary care dermatology to verify this is fungal in nature before starting any long course antifungal medication.  Patient will follow-up as an outpatient he will return immediately if develops any new or worsening signs or symptoms.  Verbalized understanding and agreement to today's plan had no further questions or concerns.  Final Clinical Impressions(s) / ED Diagnoses   Final diagnoses:  Rash    ED Discharge Orders         Ordered    clotrimazole (LOTRIMIN) 1 % cream     06/10/19 1411           Eyvonne Mechanic, PA-C 06/10/19 1411    Tegeler, Canary Brim, MD 06/10/19 [phone]

## 2019-06-10 NOTE — ED Triage Notes (Signed)
Using medical interpreter patient states for past 3 months he has had bumps on his penis, face and scalp that have caused him pain and itching. Patient denies any urinary symptoms. States his wife has been seen twice for what he thinks is a fungal or yeast infection. States only sexually active with spouse.

## 2019-06-27 IMAGING — CR DG CHEST 2V
2 series · 2 of 2 positions shown · non-contrast
Comparison: 12/28/2014

CLINICAL DATA: Abdominal pain with nonproductive cough several
days.

EXAM:
CHEST - 2 VIEW

[chest pa]
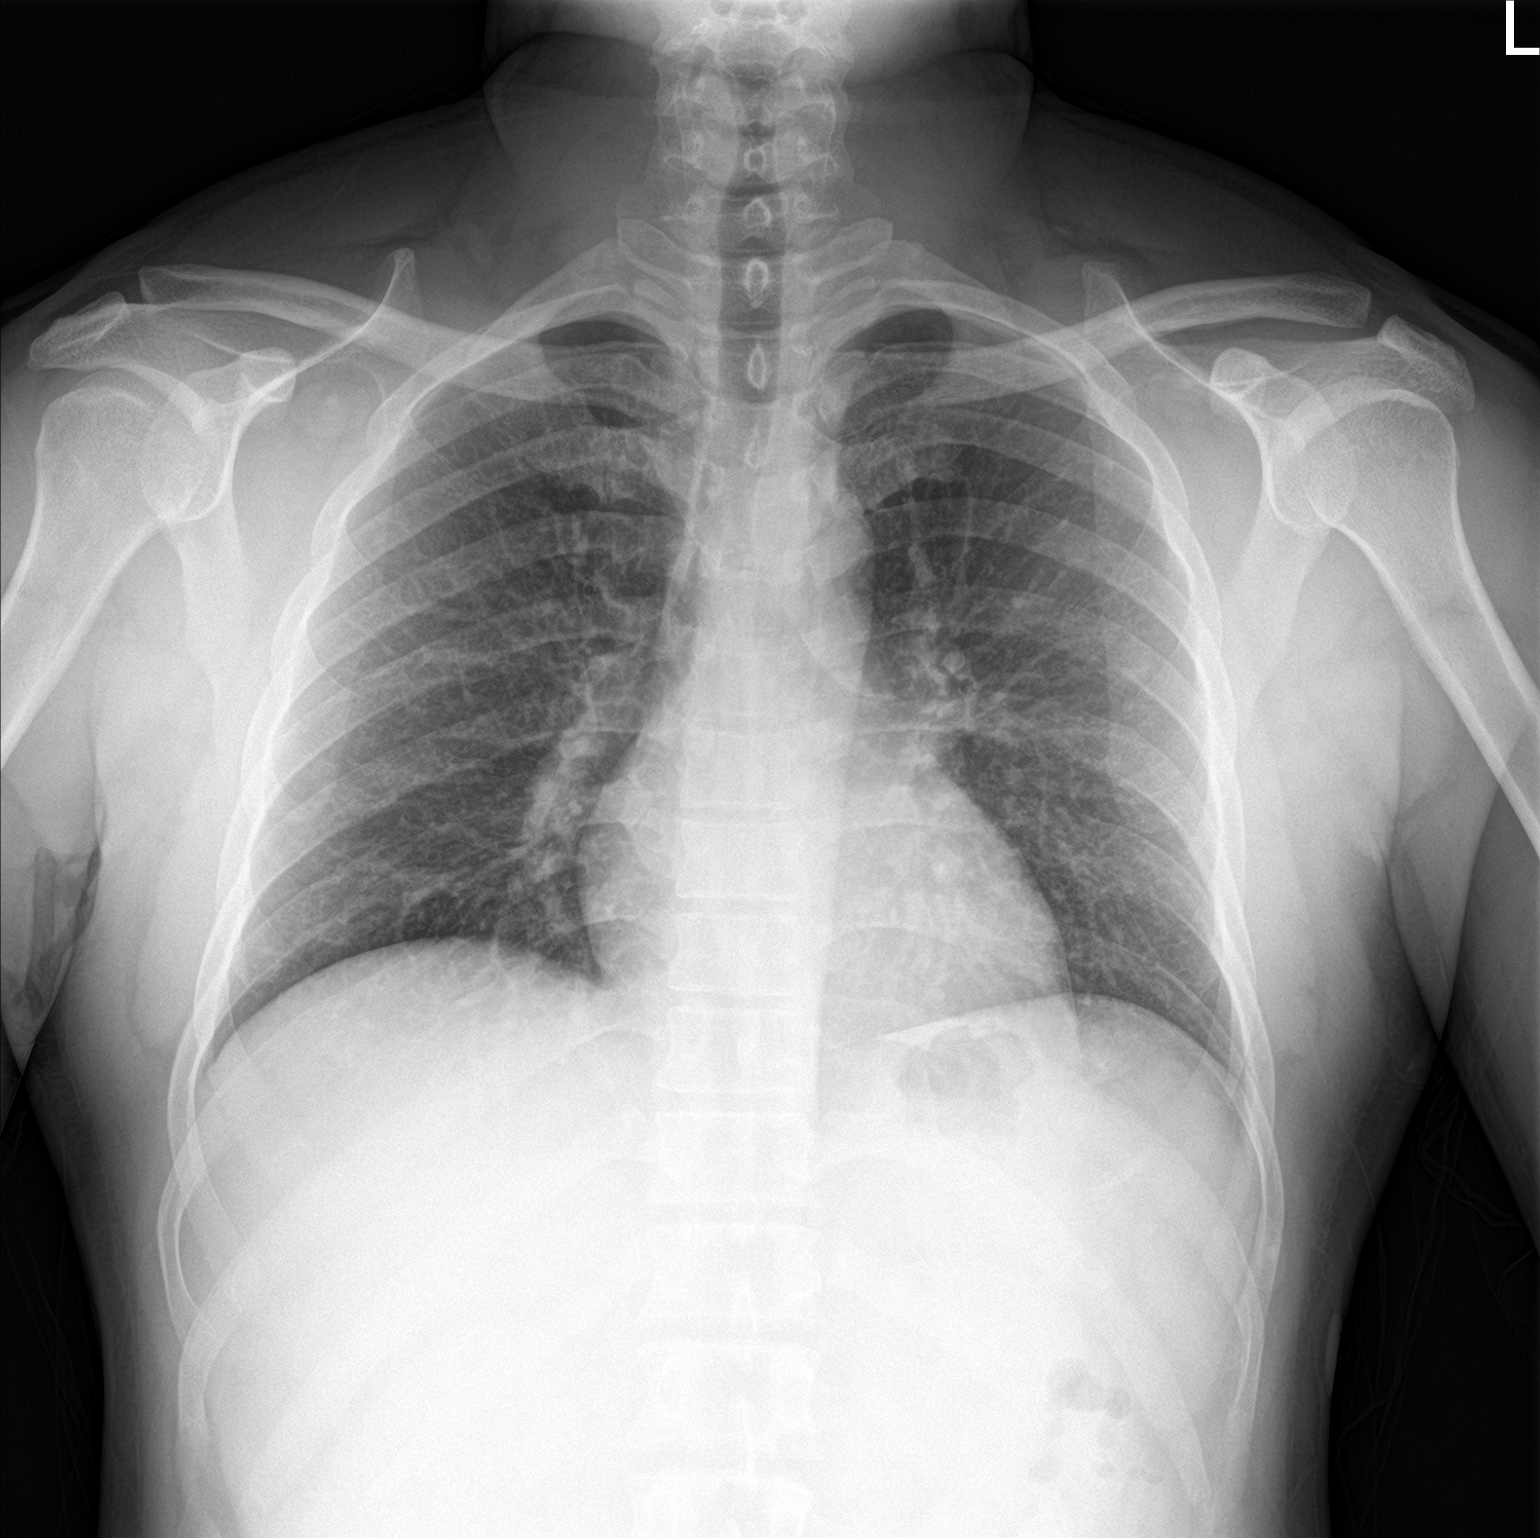

[chest lat]
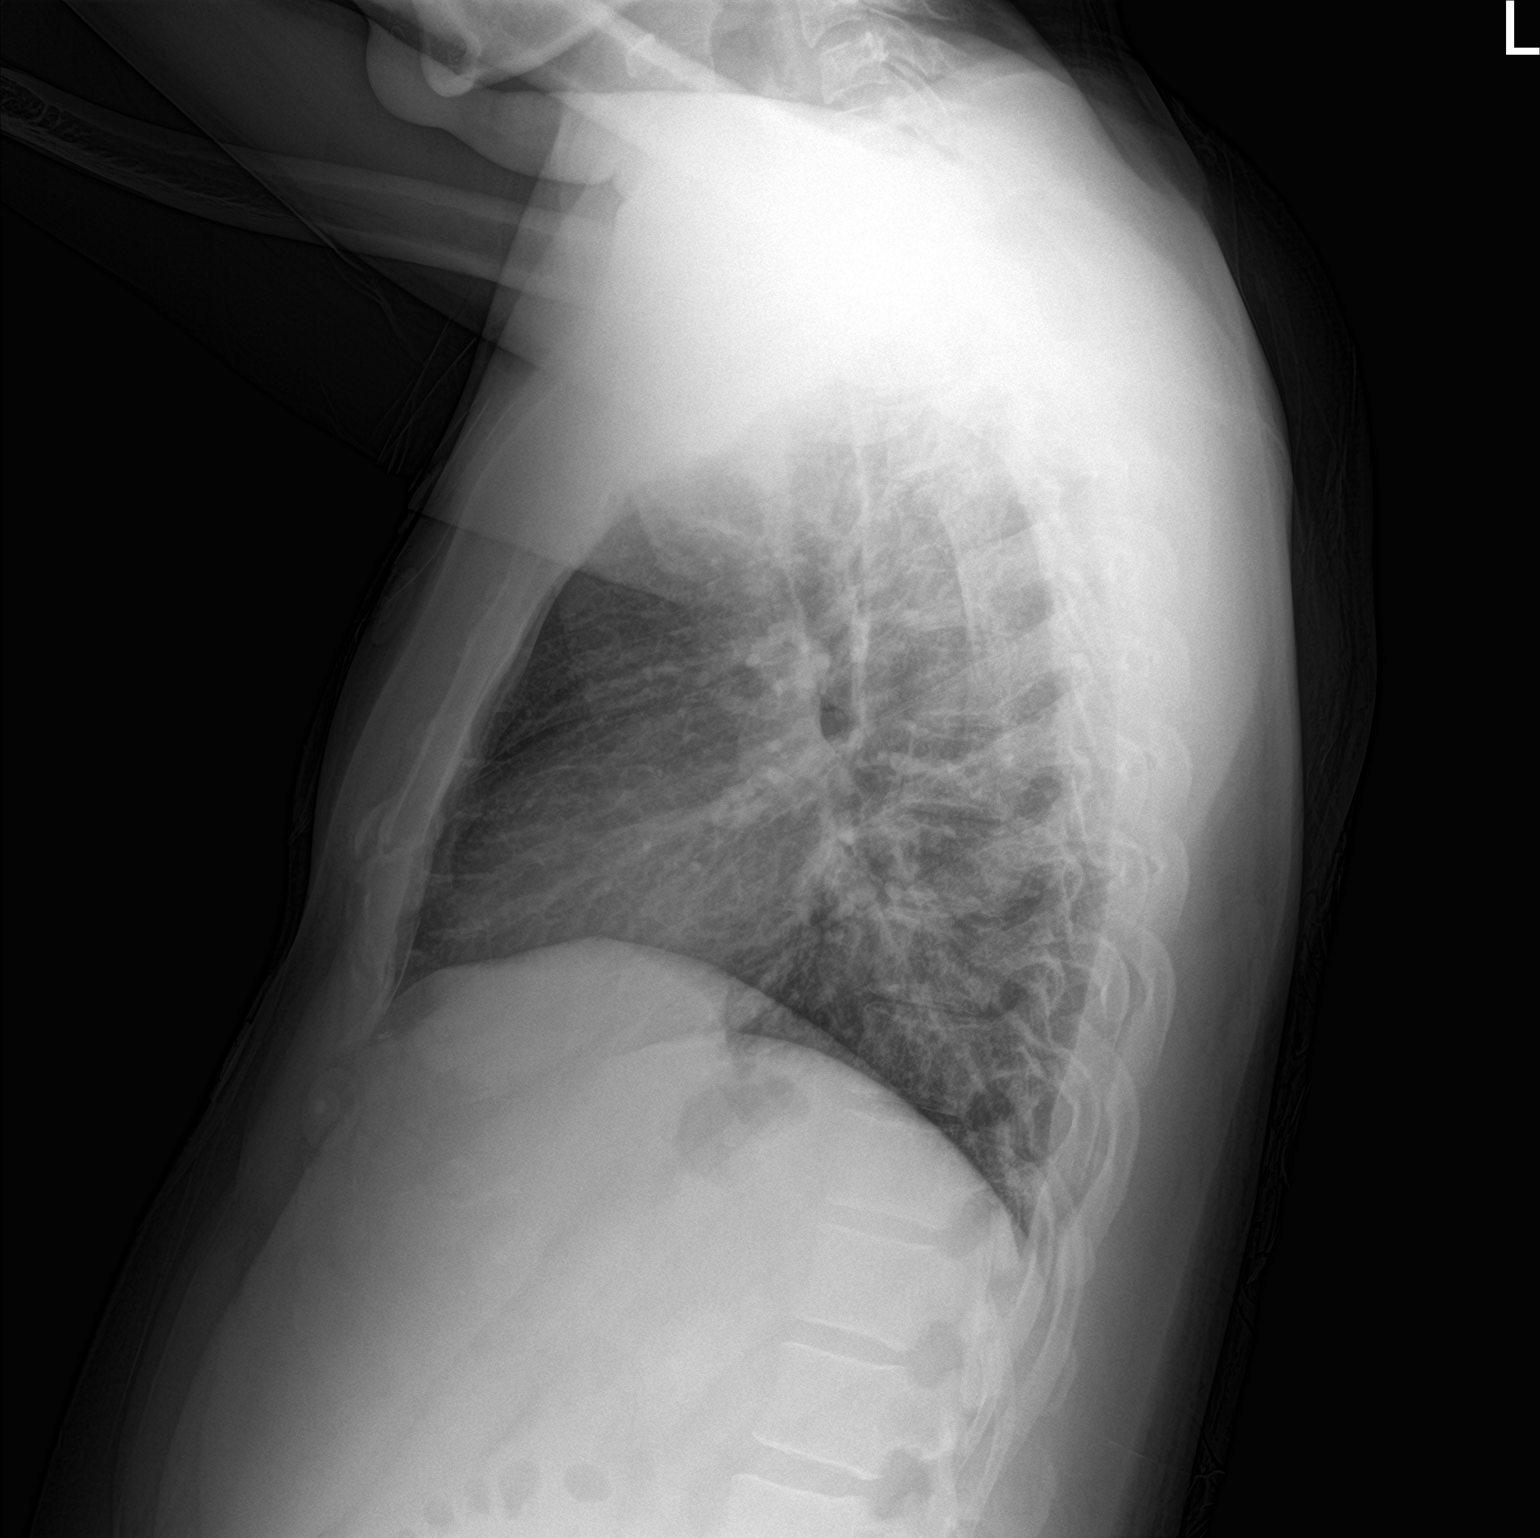

[2 of 2 positions shown; findings below may reference images not displayed]

FINDINGS: Lungs are adequately inflated without focal airspace consolidation
or effusion. Cardiomediastinal silhouette, bones and soft tissues
are unchanged.
IMPRESSION: No active cardiopulmonary disease.

## 2019-06-28 IMAGING — CT CT ABD-PELV W/ CM
2 of 4 series · 16 of 46 positions shown, 18 images · IV contrast (Omni 300)
Comparison: None.

CLINICAL DATA: Left lower quadrant pain

EXAM:
CT ABDOMEN AND PELVIS WITH CONTRAST
TECHNIQUE: Multidetector CT imaging of the abdomen and pelvis was performed
using the standard protocol following bolus administration of
intravenous contrast.
CONTRAST:  100mL OMNIPAQUE IOHEXOL 300 MG/ML  SOLN

[Series 3: a/p w/ 5mm · axial · 0.70mm/px · z∈[-574,-134]mm · 13 of 98 slices shown, 15 images]
[im 5/98  soft-tissue]
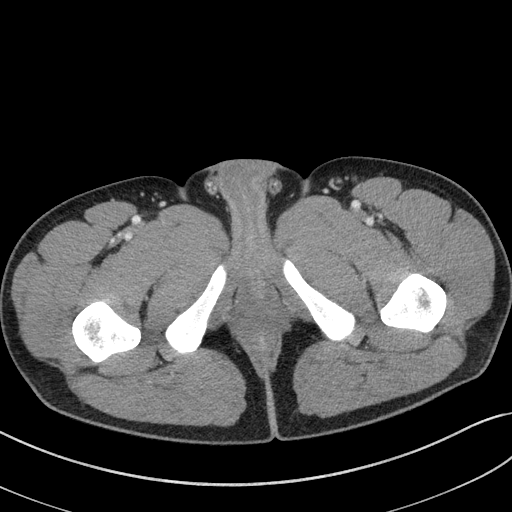
[im 5/98  bone]
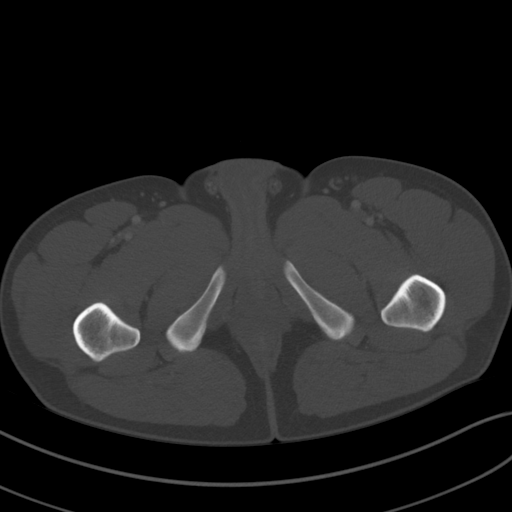
[im 13/98  soft-tissue]
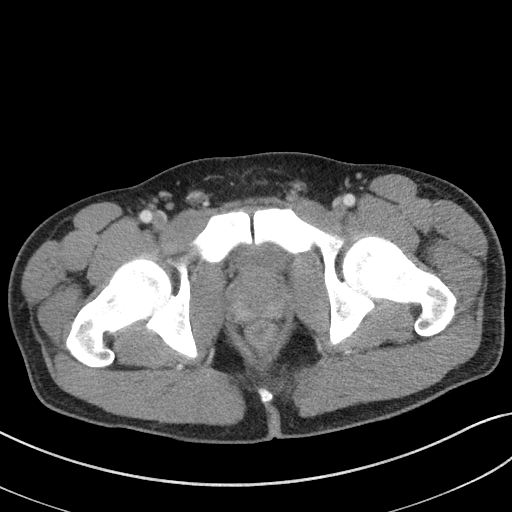
[im 21/98  soft-tissue]
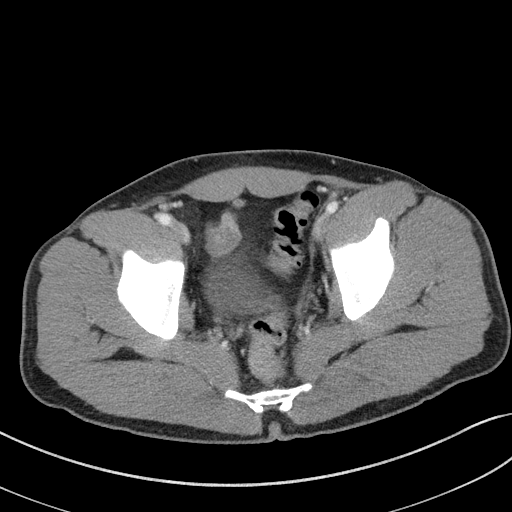
[im 29/98  soft-tissue]
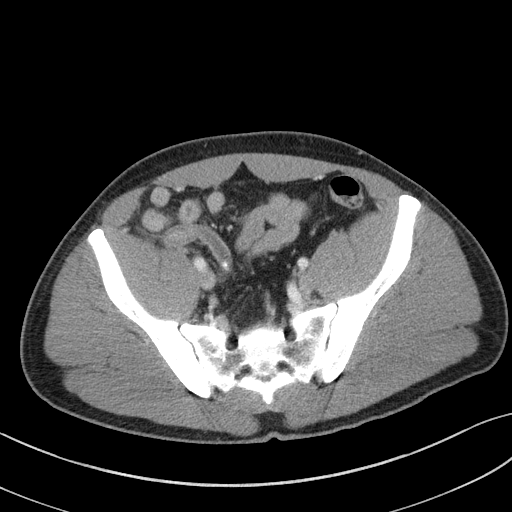
[im 33/98  soft-tissue]
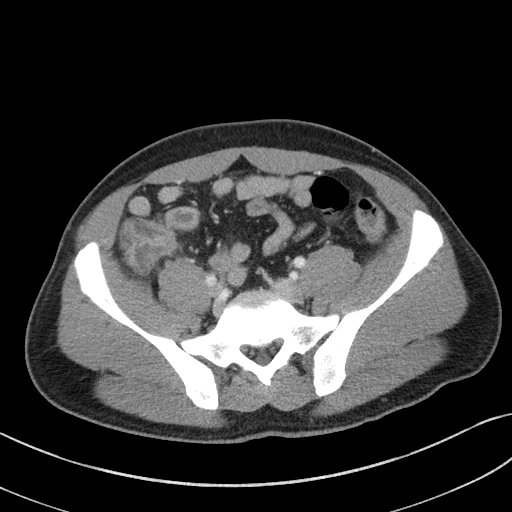
[im 41/98  soft-tissue]
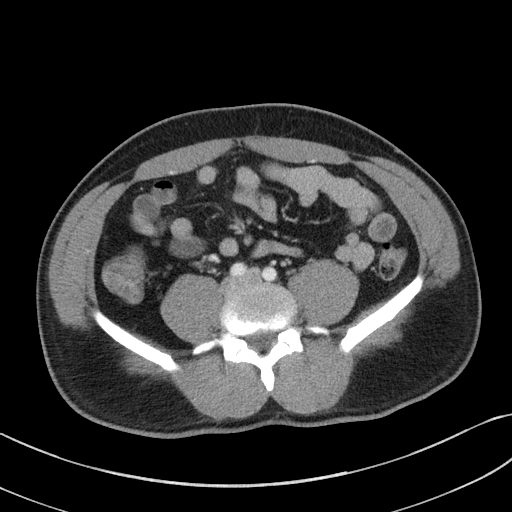
[im 49/98  soft-tissue]
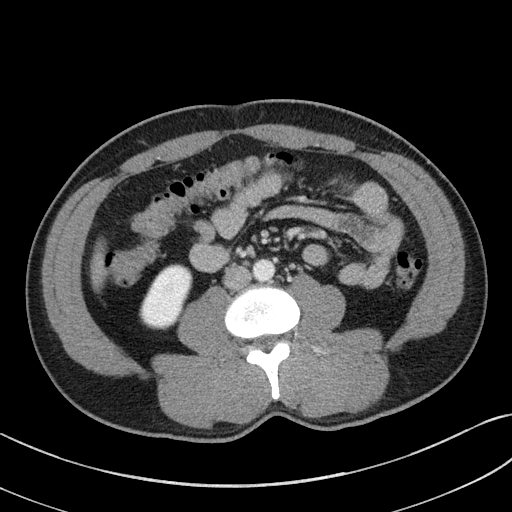
[im 57/98  soft-tissue]
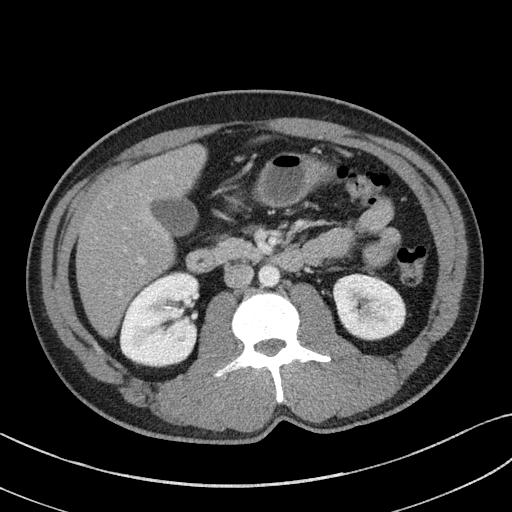
[im 65/98  soft-tissue]
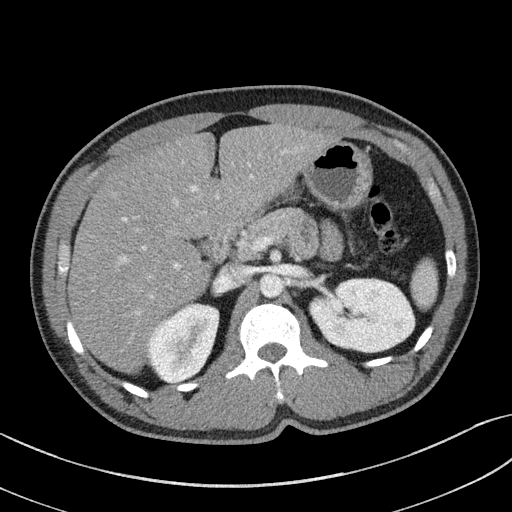
[im 65/98  bone]
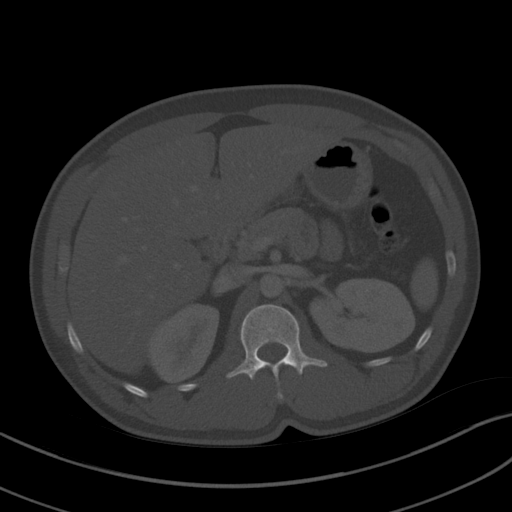
[im 69/98  soft-tissue]
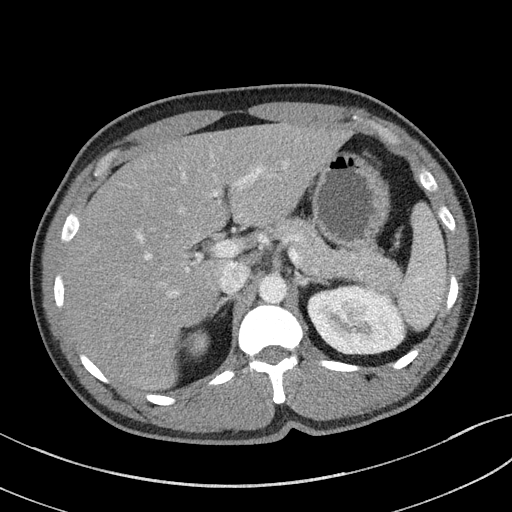
[im 77/98  soft-tissue]
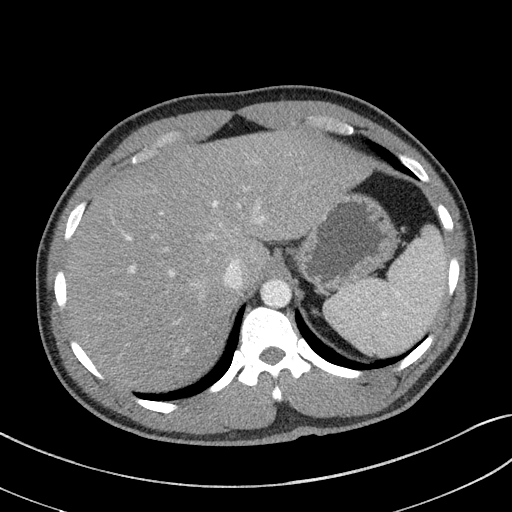
[im 85/98  soft-tissue]
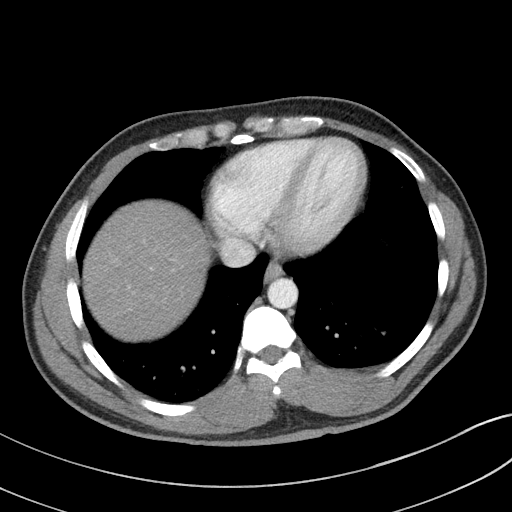
[im 93/98  soft-tissue]
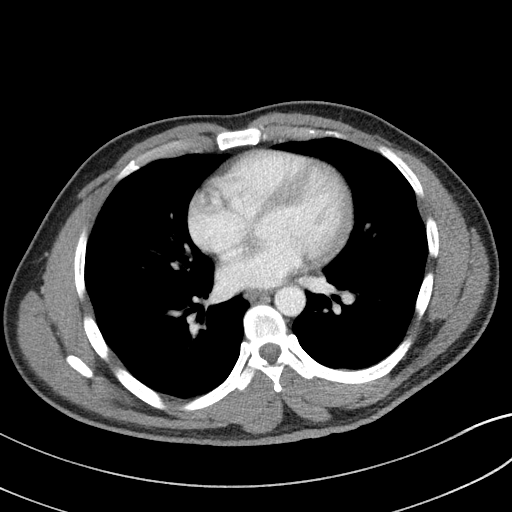

[Series 6: a/p w/ cor · coronal · 0.75mm/px · 3 of 120 slices shown]
[im 40/120  soft-tissue]
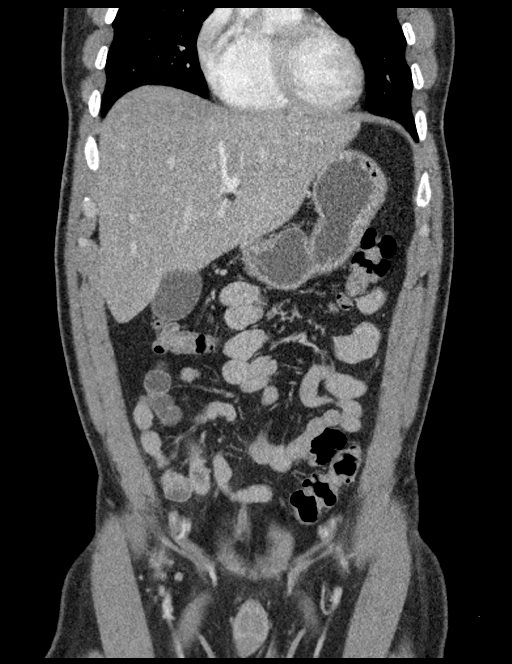
[im 53/120  soft-tissue]
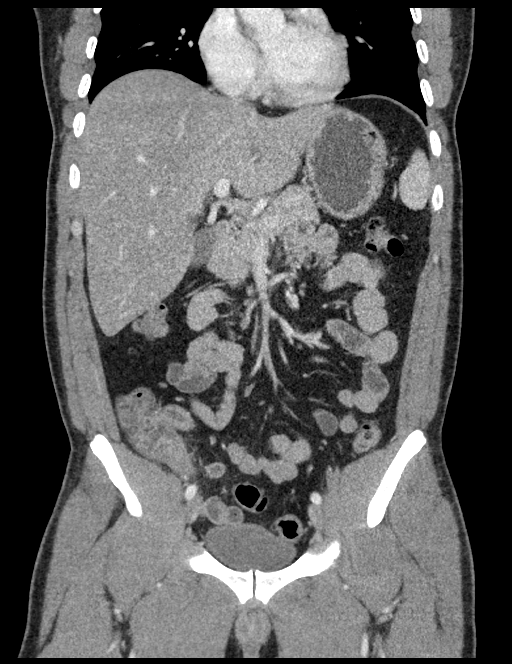
[im 67/120  soft-tissue]
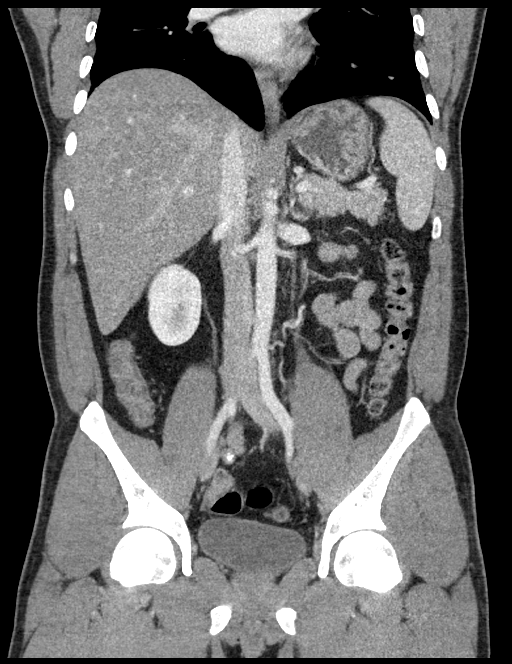

[16 of 46 positions shown; findings below may reference images not displayed]

FINDINGS: Lower chest: No acute abnormality.

Hepatobiliary: No focal liver abnormality is seen. No gallstones,
gallbladder wall thickening, or biliary dilatation.

Pancreas: Unremarkable. No pancreatic ductal dilatation or
surrounding inflammatory changes.

Spleen: Normal in size without focal abnormality.

Adrenals/Urinary Tract: Adrenal glands are unremarkable. Kidneys are
normal, without renal calculi, focal lesion, or hydronephrosis.
Bladder is unremarkable.

Stomach/Bowel: Stomach is within normal limits. Appendix is
abnormal. Enlarged appendix measuring up to 13 mm. Multiple small
stones in the tip of the appendix. Surrounding inflammation. No
extraluminal gas. Mild thickening of the adjacent cecum and terminal
ileum. No evidence of bowel distention.

Vascular/Lymphatic: No significant vascular findings are present. No
enlarged abdominal or pelvic lymph nodes.

Reproductive: Prostate is unremarkable.

Other: Negative for free air or free fluid

Musculoskeletal: No acute or significant osseous findings.
IMPRESSION: Findings consistent with acute non perforated appendicitis.

Appendix: Location: Right lower quadrant

Diameter: 14 mm

Appendicolith: Multiple present in the tip of the appendix

Mucosal hyper-enhancement: Mild mucosal hyperenhancement

Extraluminal gas: Negative

Periappendiceal collection: Negative

Mild thickening of the adjacent cecum and terminal ileum, likely
reactive

## 2019-09-15 ENCOUNTER — Other Ambulatory Visit: Payer: Self-pay

## 2019-09-15 ENCOUNTER — Ambulatory Visit: Payer: Self-pay | Attending: Nurse Practitioner | Admitting: Nurse Practitioner

## 2021-11-24 ENCOUNTER — Emergency Department (HOSPITAL_COMMUNITY)
Admission: EM | Admit: 2021-11-24 | Discharge: 2021-11-24 | Disposition: A | Discharge status: Home / Self Care | Payer: Self-pay | Attending: Emergency Medicine | Admitting: Emergency Medicine

## 2021-11-24 ENCOUNTER — Emergency Department (HOSPITAL_COMMUNITY): Payer: Self-pay

## 2021-11-24 DIAGNOSIS — D72829 Elevated white blood cell count, unspecified: Secondary | ICD-10-CM | POA: Insufficient documentation

## 2021-11-24 DIAGNOSIS — R8289 Other abnormal findings on cytological and histological examination of urine: Secondary | ICD-10-CM | POA: Insufficient documentation

## 2021-11-24 DIAGNOSIS — K5732 Diverticulitis of large intestine without perforation or abscess without bleeding: Secondary | ICD-10-CM | POA: Insufficient documentation

## 2021-11-24 LAB — CBC WITH DIFFERENTIAL/PLATELET
Abs Immature Granulocytes: 0.03 10*3/uL (ref 0.00–0.07)
Basophils Absolute: 0 10*3/uL (ref 0.0–0.1)
Basophils Relative: 0 %
Eosinophils Absolute: 0.1 10*3/uL (ref 0.0–0.5)
Eosinophils Relative: 1 %
HCT: 44.4 % (ref 39.0–52.0)
Hemoglobin: 15.4 g/dL (ref 13.0–17.0)
Immature Granulocytes: 0 %
Lymphocytes Relative: 14 %
Lymphs Abs: 1.7 10*3/uL (ref 0.7–4.0)
MCH: 30.5 pg (ref 26.0–34.0)
MCHC: 34.7 g/dL (ref 30.0–36.0)
MCV: 87.9 fL (ref 80.0–100.0)
Monocytes Absolute: 0.8 10*3/uL (ref 0.1–1.0)
Monocytes Relative: 7 %
Neutro Abs: 9.2 10*3/uL — ABNORMAL HIGH (ref 1.7–7.7)
Neutrophils Relative %: 78 %
Platelets: 231 10*3/uL (ref 150–400)
RBC: 5.05 MIL/uL (ref 4.22–5.81)
RDW: 12.5 % (ref 11.5–15.5)
WBC: 11.7 10*3/uL — ABNORMAL HIGH (ref 4.0–10.5)
nRBC: 0 % (ref 0.0–0.2)

## 2021-11-24 LAB — URINALYSIS, ROUTINE W REFLEX MICROSCOPIC
Bacteria, UA: NONE SEEN
Bilirubin Urine: NEGATIVE
Glucose, UA: NEGATIVE mg/dL
Ketones, ur: NEGATIVE mg/dL
Leukocytes,Ua: NEGATIVE
Nitrite: NEGATIVE
Protein, ur: NEGATIVE mg/dL
Specific Gravity, Urine: 1.025 (ref 1.005–1.030)
pH: 5 (ref 5.0–8.0)

## 2021-11-24 LAB — COMPREHENSIVE METABOLIC PANEL
ALT: 48 U/L — ABNORMAL HIGH (ref 0–44)
AST: 31 U/L (ref 15–41)
Albumin: 4.2 g/dL (ref 3.5–5.0)
Alkaline Phosphatase: 67 U/L (ref 38–126)
Anion gap: 9 (ref 5–15)
BUN: 13 mg/dL (ref 6–20)
CO2: 21 mmol/L — ABNORMAL LOW (ref 22–32)
Calcium: 9.2 mg/dL (ref 8.9–10.3)
Chloride: 106 mmol/L (ref 98–111)
Creatinine, Ser: 0.76 mg/dL (ref 0.61–1.24)
GFR, Estimated: >60 mL/min (ref >60)
Glucose, Bld: 133 mg/dL — ABNORMAL HIGH (ref 70–99)
Potassium: 3.8 mmol/L (ref 3.5–5.1)
Sodium: 136 mmol/L (ref 135–145)
Total Bilirubin: 0.7 mg/dL (ref 0.3–1.2)
Total Protein: 7.4 g/dL (ref 6.5–8.1)

## 2021-11-24 LAB — LIPASE, BLOOD: Lipase: 43 U/L (ref 11–51)

## 2021-11-24 MED ORDER — CIPROFLOXACIN IN D5W 400 MG/200ML IV SOLN
400.0000 mg | Freq: Once | INTRAVENOUS | Status: AC
  Administered 2021-11-24: 400 mg via INTRAVENOUS
  Filled 2021-11-24: qty 200

## 2021-11-24 MED ORDER — METRONIDAZOLE 500 MG PO TABS: 500.0000 mg | ORAL_TABLET | Freq: Two times a day (BID) | ORAL | 0 refills | Status: AC

## 2021-11-24 MED ORDER — MORPHINE SULFATE (PF) 4 MG/ML IV SOLN
4.0000 mg | Freq: Once | INTRAVENOUS | Status: AC
  Administered 2021-11-24: 4 mg via INTRAVENOUS
  Filled 2021-11-24: qty 1

## 2021-11-24 MED ORDER — CIPROFLOXACIN HCL 500 MG PO TABS: 500.0000 mg | ORAL_TABLET | Freq: Two times a day (BID) | ORAL | 0 refills | Status: AC

## 2021-11-24 MED ORDER — IBUPROFEN 600 MG PO TABS: 600.0000 mg | ORAL_TABLET | Freq: Four times a day (QID) | ORAL | 0 refills | Status: AC | PRN

## 2021-11-24 MED ORDER — METRONIDAZOLE 500 MG/100ML IV SOLN
500.0000 mg | Freq: Two times a day (BID) | INTRAVENOUS | Status: DC
  Administered 2021-11-24: 500 mg via INTRAVENOUS
  Filled 2021-11-24: qty 100

## 2021-11-24 NOTE — ED Provider Notes (Signed)
?[facility] EMERGENCY DEPARTMENT ?Provider Note ? ? ?CSN: [id number] ?Arrival date & time: 11/24/21  3818 ? ?  ? ?History ? ?Chief Complaint  ?Patient presents with  ? Abdominal Pain  ? Testicle Pain  ? ? ?Curtis Petersen is a 36 y.o. male. ? ?The history is provided by the patient and the spouse. The history is limited by a language barrier. A language interpreter was used.  ?Abdominal Pain ?Testicle Pain ?Associated symptoms include abdominal pain.  ? ?36 year old male significant history of prior laparoscopic appendectomy who presents for evaluation of abdominal pain.  History obtained using language interpreter as patient is Spanish-speaking.  Patient report for the past 3 days he has noticed pain on urination and increased urinary frequency.  He endorsed lower abdominal discomfort as well.  Pain initially radiates towards his testicles but that has since resolved.  He also complaining of some pain to his buttock region.  He denies any associated fever chills no nausea vomiting diarrhea constipation no hematuria no penile discharge.  Denies any new sexual partner.  He denies any trauma.  Symptoms moderate in severity.  He has not noticed any abnormal swelling ? ?Home Medications ?Prior to Admission medications   ?Medication Sig Start Date End Date Taking? Authorizing Provider  ?acetaminophen (TYLENOL) 500 MG tablet Take 2 tablets (1,000 mg total) by mouth every 8 (eight) hours as needed for mild pain. 12/24/17   Juliet Rude, PA-C  ?benzonatate (TESSALON) 100 MG capsule Take 1 capsule (100 mg total) by mouth 3 (three) times daily as needed for cough. 12/22/17   Ward, Chase Picket, PA-C  ?clotrimazole (LOTRIMIN) 1 % cream Apply to affected area 2 times daily 06/10/19   Hedges, Tinnie Gens, PA-C  ?traMADol (ULTRAM) 50 MG tablet Take 1 tablet (50 mg total) by mouth every 6 (six) hours as needed (pain not controlled with tylenol and ibuprofen). 12/24/17   Juliet Rude, PA-C  ?   ? ?Allergies     ?Patient has no known allergies.   ? ?Review of Systems   ?Review of Systems  ?Gastrointestinal:  Positive for abdominal pain.  ?Genitourinary:  Positive for testicular pain.  ?All other systems reviewed and are negative. ? ?Physical Exam ?Updated Vital Signs ?BP (!) 148/105 (BP Location: Right Arm)   Pulse 83   Temp 98.6 ?F (37 ?C) (Oral)   Resp 16   SpO2 100%  ?Physical Exam ?Vitals and nursing note reviewed.  ?Constitutional:   ?   General: He is not in acute distress. ?   Appearance: He is well-developed.  ?   Comments: Appears uncomfortable  ?HENT:  ?   Head: Atraumatic.  ?Eyes:  ?   Conjunctiva/sclera: Conjunctivae normal.  ?Cardiovascular:  ?   Rate and Rhythm: Normal rate and regular rhythm.  ?Pulmonary:  ?   Effort: Pulmonary effort is normal.  ?   Breath sounds: Normal breath sounds.  ?Abdominal:  ?   Tenderness: There is abdominal tenderness in the suprapubic area. There is no right CVA tenderness or left CVA tenderness.  ?Genitourinary: ?   Comments: Chaperone present during exam.  No inguinal lymphadenopathy or inguinal hernia noted.  Uncircumcised penis free of lesion or rash.  Testicle nontender with normal lie.  Normal scrotum ?Musculoskeletal:  ?   Cervical back: Neck supple.  ?Skin: ?   Findings: No rash.  ?Neurological:  ?   Mental Status: He is alert.  ? ? ?ED Results / Procedures / Treatments   ?Labs ?(all  labs ordered are listed, but only abnormal results are displayed) ?Labs Reviewed  ?URINALYSIS, ROUTINE W REFLEX MICROSCOPIC - Abnormal; Notable for the following components:  ?    Result Value  ? Hgb urine dipstick SMALL (*)   ? All other components within normal limits  ?CBC WITH DIFFERENTIAL/PLATELET - Abnormal; Notable for the following components:  ? WBC 11.7 (*)   ? Neutro Abs 9.2 (*)   ? All other components within normal limits  ?COMPREHENSIVE METABOLIC PANEL - Abnormal; Notable for the following components:  ? CO2 21 (*)   ? Glucose, Bld 133 (*)   ? ALT 48 (*)   ? All other  components within normal limits  ?LIPASE, BLOOD  ? ? ?EKG ?None ? ?Radiology ?CT Renal Stone Study ? ?Result Date: 11/24/2021 ?CLINICAL DATA:  Acute lower abdominal pain. EXAM: CT ABDOMEN AND PELVIS WITHOUT CONTRAST TECHNIQUE: Multidetector CT imaging of the abdomen and pelvis was performed following the standard protocol without IV contrast. RADIATION DOSE REDUCTION: This exam was performed according to the departmental dose-optimization program which includes automated exposure control, adjustment of the mA and/or kV according to patient size and/or use of iterative reconstruction technique. COMPARISON:  January 27, 2018. FINDINGS: Lower chest: No acute abnormality. Hepatobiliary: Hepatic steatosis. No gallstones or biliary dilatation is noted. Pancreas: Unremarkable. No pancreatic ductal dilatation or surrounding inflammatory changes. Spleen: Normal in size without focal abnormality. Adrenals/Urinary Tract: Adrenal glands are unremarkable. Kidneys are normal, without renal calculi, focal lesion, or hydronephrosis. Bladder is unremarkable. Stomach/Bowel: The stomach appears normal. Status post appendectomy. There is no evidence of bowel obstruction. Sigmoid diverticulitis is noted without abscess formation. Vascular/Lymphatic: No significant vascular findings are present. No enlarged abdominal or pelvic lymph nodes. Reproductive: Prostate is unremarkable. Other: No abdominal wall hernia or abnormality. No abdominopelvic ascites. Musculoskeletal: No acute or significant osseous findings. IMPRESSION: Sigmoid diverticulitis is noted without abscess formation. Hepatic steatosis. Electronically Signed   By: Lupita Raider M.D.   On: 11/24/2021 09:48   ? ?Procedures ?Procedures  ? ? ?Medications Ordered in ED ?Medications  ?metroNIDAZOLE (FLAGYL) IVPB 500 mg (500 mg Intravenous New Bag/Given 11/24/21 1109)  ?morphine (PF) 4 MG/ML injection 4 mg (4 mg Intravenous Given 11/24/21 0921)  ?ciprofloxacin (CIPRO) IVPB 400 mg (0 mg  Intravenous Stopped 11/24/21 1108)  ?morphine (PF) 4 MG/ML injection 4 mg (4 mg Intravenous Given 11/24/21 1112)  ? ? ?ED Course/ Medical Decision Making/ A&P ?  ?                        ?Medical Decision Making ?Amount and/or Complexity of Data Reviewed ?Labs: ordered. ?Radiology: ordered. ? ?Risk ?Prescription drug management. ? ? ?BP (!) 148/105 (BP Location: Right Arm)   Pulse 83   Temp 98.6 ?F (37 ?C) (Oral)   Resp 16   SpO2 100%  ? ?9:19 AM ?Patient here with complaint of suprapubic abdominal pain as well as urinary discomfort and burning urination ongoing for the past 3 days.  Symptoms suggestive of a urinary tract infection.  He denies having any blood in his urine or prior penile discharge to suggest kidney stones or STI.  On exam he does have some suprapubic tenderness but no CVA tenderness.  Normal-appearing genital, testicles nontender with normal lie no evidence to suggest testicular torsion, or scrotal abscess.  Doubt orchitis or epididymitis.  Work-up initiated, IV opiate medication given ? ?10:02 AM ?Labs and imaging independently viewed interpreted by me.  Patient has  mildly elevated white count of 11.7, urinalysis shows small amount of hemoglobin and urine dipstick without signs of urinary tract infection.  Due to his abdominal discomfort, an abdominal and pelvis CT scan obtained which shows evidence of sigmoid diverticulitis without abscess formation.  The prostate is unremarkable.  This finding is consistent with patient presentation therefore patient will be treated with antibiotic.  Cipro and Flagyl given. ? ?I discussed this finding with patient and family member using language interpreter and able to answer all questions to the satisfaction.  We will also provide work note for 3 days per request. ? ? ?This patient presents to the ED for concern of abd pain, this involves an extensive number of treatment options, and is a complaint that carries with it a high risk of complications and  morbidity.  The differential diagnosis includes UTI, kidney stone, diverticulitis, colitis, pancreatitis, cholecystitis, testicular torsion, epididymitis ? ?Co morbidities that complicate the patient evaluation ?language b

## 2021-11-24 NOTE — ED Triage Notes (Signed)
Pt. Stated, pain in lower stomach and my lower parts. Started about 3 days ago. ?

## 2024-04-02 ENCOUNTER — Other Ambulatory Visit: Payer: Self-pay

## 2024-04-02 ENCOUNTER — Emergency Department (HOSPITAL_COMMUNITY)
Admission: EM | Admit: 2024-04-02 | Discharge: 2024-04-02 | Disposition: A | Discharge status: Home / Self Care | Payer: Self-pay | Attending: Emergency Medicine | Admitting: Emergency Medicine

## 2024-04-02 ENCOUNTER — Encounter (HOSPITAL_COMMUNITY): Payer: Self-pay

## 2024-04-02 DIAGNOSIS — H60391 Other infective otitis externa, right ear: Secondary | ICD-10-CM | POA: Insufficient documentation

## 2024-04-02 MED ORDER — CIPROFLOXACIN-DEXAMETHASONE 0.3-0.1 % OT SUSP: 4.0000 [drp] | Freq: Two times a day (BID) | OTIC | 0 refills | Status: AC

## 2024-04-02 NOTE — ED Triage Notes (Signed)
 Patient reports his right ear is ringing.

## 2024-04-02 NOTE — ED Notes (Signed)
 Patient verbalized understanding of d/c instructions. Was told to follow up with a PCP and info shown in d/c papers--stated understanding. Pt. Stable upon discharge from ED and ambulatory with family.

## 2024-04-02 NOTE — ED Triage Notes (Signed)
 Pt states his right ear is clogged for 2 weeks. C/O headache. Denies dizziness.

## 2024-04-02 NOTE — ED Provider Notes (Signed)
  [place] EMERGENCY DEPARTMENT AT [facility] Provider Note HPI Curtis Petersen is a 38 y.o. male with no significant past medical history resents the emergency department due to sensation that his right ear is clogged.  Patient has been experiencing this sensation for about 2 weeks and it is worsened.  He had water exposure in an ocean 2 weeks ago.  5 days ago he had subjective fevers and a headache that have resolved.  Denies other infectious symptoms.  Denies current headache or vision changes.  Denies drainage from the ear  History reviewed. No pertinent past medical history. Past Surgical History:  Procedure Laterality Date   LAPAROSCOPIC APPENDECTOMY N/A 12/23/2017   Procedure: LAPAROSCOPIC APPENDECTOMY;  Surgeon: Kimble Agent, MD;  Location: [facility] OR;  Service: General;  Laterality: N/A;    Review of Systems Pertinent positives and negative findings are listed as part of the History of Present Illness and MDM  Physical Exam Vitals:   04/02/24 1238 04/02/24 1242 04/02/24 1243  BP: (!) 141/73    Pulse: 75    Resp: 16    Temp: 97.9 F (36.6 C)    SpO2: 96%    Weight:   70 kg  Height:  5' 1 (1.549 m)      Constitutional Nursing notes reviewed Vital signs reviewed  HEENT No obvious trauma Pupils round, equal, and reactive to light. Pupils cross midline Right external ear canal is edematous with purulent discharge Tympanic membrane clear with appropriate light reflex Left external ear canal and TM normal with no edema, purulent discharge, or bulging No mastoid tenderness to palpation, fluctuance, induration or erythema  Respiratory Effort normal Breathing well on room air CTAB  CV Normal rate and rhythm   Neuro Awake and alert Pupils cross midline Moving all extremities    MDM: Medical Decision Making Problems Addressed: Infective otitis externa, right: complicated acute illness or injury  Risk Prescription drug management.    Initial Differential  Diagnoses includes otitis media, otitis externa, mastoiditis, viral URI, pharyngitis  I reviewed the patient's vitals, the nursing triage note and evaluated the patient at bedside.   Patient presents with 2 weeks of fullness in the right ear after water exposure.  Exam is consistent with acute otitis externa.  No evidence of otitis media.  No evidence of mastoiditis.  The patient had subjective fever and headache 5 days ago that self resolved.  Lungs are clear to auscultation bilaterally.  Patient discharged home with Ciprodex  drops for otitis externa.   Procedures: Procedures  Medications administered in the ED: Medications - No data to display   Impression: 1. Infective otitis externa, right      Patient's presentation is most consistent with acute illness / injury with system symptoms.  Disposition: ED Disposition:  Discharge   Discharge: Patient is felt to be medically appropriate for discharge at this time. Patient was instructed to follow up with their primary care doctor/specialists listed above for re-evaluation. Patient was given strict return precautions.  ED Discharge Orders          Ordered    ciprofloxacin -dexamethasone  (CIPRODEX ) OTIC suspension  2 times daily        04/02/24 1607                  Dionisio Blunt, MD 04/02/24 1613    Dean Clarity, MD 04/02/24 [id number]

## 2024-04-02 NOTE — Discharge Instructions (Addendum)
 Use gotas de Ciprodex : 4 gotas en el odo derecho dos veces al da durante 7 [place].   Regrese a urgencias si presenta fiebre, dolor intenso, supuracin del odo o si tiene cualquier otro motivo para creer que necesita atencin de [employer].

## 2024-08-04 ENCOUNTER — Institutional Professional Consult (permissible substitution) (INDEPENDENT_AMBULATORY_CARE_PROVIDER_SITE_OTHER): Payer: Self-pay

## 2024-08-05 ENCOUNTER — Ambulatory Visit (INDEPENDENT_AMBULATORY_CARE_PROVIDER_SITE_OTHER): Payer: Self-pay

## 2024-08-05 VITALS — BP 123/71 | HR 67 | Wt 154.0 lb

## 2024-08-05 DIAGNOSIS — H6121 Impacted cerumen, right ear: Secondary | ICD-10-CM

## 2024-08-05 DIAGNOSIS — H7441 Polyp of right middle ear: Secondary | ICD-10-CM

## 2024-08-05 MED ORDER — DEXAMETHASONE 0.1 % OP SUSP: 4.0000 [drp] | Freq: Two times a day (BID) | OPHTHALMIC | 0 refills | Status: AC

## 2024-08-05 MED ORDER — CIPROFLOXACIN HCL 0.3 % OP SOLN: 3.0000 [drp] | Freq: Two times a day (BID) | OPHTHALMIC | 0 refills | Status: AC

## 2024-08-06 NOTE — Progress Notes (Addendum)
 Dear Dr. Theotis, Here is my assessment for our mutual patient, Curtis Petersen. Thank you for allowing me the opportunity to care for your patient. Please do not hesitate to contact me should you have any other questions. Sincerely, Dr. Penne Petersen  Otolaryngology Clinic Note Referring provider: Dr. Theotis HPI:  Discussed the use of AI scribe software for clinical note transcription with the patient, who gave verbal consent to proceed.  History of Present Illness Curtis Petersen is a 39 year old male who presents with chronic left ear blockage.  Aural obstruction and hearing loss - Progressive left ear blockage since September, described as a sensation of a clogged ear with intermittent improvement. - Decreased hearing on the left side, with only partial improvement after recent cleaning attempts. - Right ear remains asymptomatic.  Otitis externa and ear canal polyps - Diagnosed with otitis externa in the left ear, managed with topical ear drops only, without oral antibiotics. - Two polyps present in the left ear canal. - No persistent otorrhea, but manipulation with Q-tips has resulted in episodes of bleeding and purulent discharge. - Sore developed on the auricle from scratching, increasing discomfort and worsening the sensation of blockage.  Cerumen impaction and self-management - Attempts at ear irrigation were unsuccessful. - Q-tip use has pushed cerumen deeper into the canal, exacerbating symptoms. - Ran out of prescribed ear drops two weeks ago and has since used an over-the-counter product.  Associated nasal obstruction - During periods of severe left ear blockage, experienced a sensation of impaired breathing through the ipsilateral nostril, leading to distress.  Independent Review of Additional Tests or Records:  Reviewed external note from referring PCP, Curtis Petersen relevant history incorporated into todays evaluation.  PMH/Meds/All/SocHx/FamHx/ROS:  No  past medical history on file.   Past Surgical History:  Procedure Laterality Date   LAPAROSCOPIC APPENDECTOMY N/A 12/23/2017   Procedure: LAPAROSCOPIC APPENDECTOMY;  Surgeon: Curtis Agent, MD;  Location: [facility] OR;  Service: General;  Laterality: N/A;    No family history on file.   Social Connections: Not on file     Current Medications[1]   Physical Exam:   BP 123/71 (BP Location: Right Arm, Patient Position: Sitting, Cuff Size: Normal)   Pulse 67   Wt 154 lb (69.9 kg)   SpO2 95%   BMI 29.10 kg/m   The patient was awake, alert, and appropriate. The external ears were inspected, and otoscopy was performed to evaluate the external auditory canals and tympanic membranes. The nasal cavity and septum were examined for mucosal changes, obstruction, or discharge. The oral cavity and oropharynx were inspected for mucosal lesions, infection, or tonsillar hypertrophy. The neck was palpated for lymphadenopathy, thyroid abnormalities, or other masses. Cranial nerve function was grossly intact.  Pertinent Findings: General: Well developed, well nourished. No acute distress. Voice without hoarseness Head/Face: Normocephalic. No sinus tenderness. Facial nerve intact and equal bilaterally. No facial lacerations. Eyes: PERRL, no scleral icterus or conjunctival hemorrhage. EOMI. Hearing: Normal speech reception.  Nose: No gross deformity or lesions. No purulent discharge. No turbinate hypertrophy.  Mouth/Oropharynx: Lips without any lesions. Dentition fair. No mucosal lesions within the oropharynx. No tonsillar enlargement, exudate, or lesions. Pharyngeal walls symmetrical. Uvula midline. Tongue midline without lesions. Larynx: See TFL if applicable Nasopharynx: See TFL if applicable Neck: Trachea midline. No masses. No thyromegaly or nodules palpated. No crepitus. Lymphatic: No lymphadenopathy in the neck. Respiratory: No stridor or distress. Room air. Cardiovascular: Regular rate and  rhythm. Extremities: No edema or cyanosis. Warm and well-perfused. Skin: No  scars or lesions on face or neck. Neurologic: CN II-XII grossly intact. Moving all extremities without gross abnormality. Other:  Physical Exam HEENT: Atraumatic, normocephalic. Right ear with cerumen impaction and two polyps. Left ear normal. Tympanic membrane with normal landmarks.  Seprately Identifiable Procedures:  I personally ordered, reviewed and interpreted the following with the patient today Procedure: Bilateral ear microscopy and cerumen removal using microscope (CPT [phone]) - Mod 25 Pre-procedure diagnosis: Cerumen impaction right external ear/ears Post-procedure diagnosis: same Indication: right cerumen impaction; given patient's otologic complaints and history as well as for improved and comprehensive examination of external ear and tympanic membrane, bilateral otologic examination using microscope was performed and impacted cerumen removed  Procedure: Patient was placed semi-recumbent. Both ear canals were examined using the microscope with findings above. Cerumen removed on right using suction and currette with improvement in EAC examination and patency. Left: EAC was patent. TM was intact . Middle ear was aerated. Drainage: absent Right: EAC was cleared lateral. Medial cerumen impaction present. TM was not visualized. Middle ear was not visualized. Drainage: absent Patient tolerated the procedure well.  Impression & Plans:  Curtis Petersen is a 39 y.o. male  1. Hearing loss due to cerumen impaction, right   2. Aural polyp, right    - Findings and diagnoses discussed in detail with the patient. - Risks, benefits, and alternatives were reviewed. Through shared decision making, the patient elects to proceed with below. 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 Canal trauma and impaction likely exacerbated by repeated cotton swab use. Two polyps present, attributed to chronic inflammation. Partial cerumen removal achieved in clinic; complete clearance not possible due to dense impaction and canal inflammation. - Performed partial cerumen removal and suctioning in clinic. - Prescribed topical steroid ear drops for two weeks to soften cerumen and reduce inflammation. - Advised against insertion of cotton swabs or any objects into the ear canal. - Recommended lying on side for five minutes after instilling drops. - Provided guidance to use baby oil drops if additional softening is needed or if he feels the urge to use cotton swabs. - Scheduled follow-up in two weeks for further cerumen removal and reassessment of canal polyps. - Provided prescription for ear drops.  - Orders placed: No orders of the defined types were placed in this encounter.  - Medications prescribed/continued/adjusted:  Meds ordered this encounter  Medications   ciprofloxacin  (CILOXAN ) 0.3 % ophthalmic solution    Sig: Place 3 drops into both eyes in the morning and at bedtime. Administer 1 drop, every 2 hours, while awake, for 2 days. Then 1 drop, every 4 hours, while awake, for the next 5 days.    Dispense:  10 mL    Refill:  0   dexamethasone  (DECADRON ) 0.1 % ophthalmic suspension    Sig: Place 4 drops into the right eye 2 (two) times daily. Right ear BID x 14 days    Dispense:  10 mL    Refill:  0   - Education materials provided to the patient. - Follow up: 2-4 weeks for cerumen removal. Patient instructed to return sooner or go to the ED if new/worsening symptoms develop.   Thank you for allowing me the opportunity to care for your patient. Please do not hesitate to contact me should you have any other questions.  Sincerely, Curtis Croak, DO Otolaryngologist (  ENT) [facility] Phone: [phone] Fax:  [phone]  08/06/2024, 4:52 PM        [1]  Current Outpatient Medications:    ciprofloxacin  (CILOXAN ) 0.3 % ophthalmic solution, Place 3 drops into both eyes in the morning and at bedtime. Administer 1 drop, every 2 hours, while awake, for 2 days. Then 1 drop, every 4 hours, while awake, for the next 5 days., Disp: 10 mL, Rfl: 0   clobetasol (TEMOVATE) 0.05 % external solution, SMARTSIG:Topical Morning-Evening, Disp: , Rfl:    dexamethasone  (DECADRON ) 0.1 % ophthalmic suspension, Place 4 drops into the right eye 2 (two) times daily. Right ear BID x 14 days, Disp: 10 mL, Rfl: 0   fluticasone (FLONASE) 50 MCG/ACT nasal spray, Place 1 spray into both nostrils 2 (two) times daily., Disp: , Rfl:    ibuprofen  (ADVIL ) 600 MG tablet, Take 1 tablet (600 mg total) by mouth every 6 (six) hours as needed., Disp: 30 tablet, Rfl: 0   ketoconazole (NIZORAL) 2 % shampoo, Apply topically., Disp: , Rfl:    triamcinolone cream (KENALOG) 0.5 %, SMARTSIG:sparingly Topical Twice Daily, Disp: , Rfl:    acetaminophen  (TYLENOL ) 500 MG tablet, Take 2 tablets (1,000 mg total) by mouth every 8 (eight) hours as needed for mild pain. (Patient not taking: Reported on 08/05/2024), Disp: 30 tablet, Rfl: 0   ciprofloxacin  (CIPRO ) 500 MG tablet, Take 1 tablet (500 mg total) by mouth every 12 (twelve) hours. (Patient not taking: Reported on 08/05/2024), Disp: 20 tablet, Rfl: 0   clotrimazole  (LOTRIMIN ) 1 % cream, Apply to affected area 2 times daily (Patient not taking: Reported on 08/05/2024), Disp: 15 g, Rfl: 0   metroNIDAZOLE  (FLAGYL ) 500 MG tablet, Take 1 tablet (500 mg total) by mouth 2 (two) times daily. (Patient not taking: Reported on 08/05/2024), Disp: 20 tablet, Rfl: 0

## 2024-09-02 ENCOUNTER — Ambulatory Visit (INDEPENDENT_AMBULATORY_CARE_PROVIDER_SITE_OTHER): Payer: Self-pay

## 2024-09-30 ENCOUNTER — Ambulatory Visit (INDEPENDENT_AMBULATORY_CARE_PROVIDER_SITE_OTHER): Payer: Self-pay
